# Patient Record
Sex: Female | Born: 1970
Health system: Southern US, Community
[De-identification: ages and names within clinical notes are randomized; demographics above are authoritative.]

## PROBLEM LIST (undated history)

## (undated) DIAGNOSIS — I10 Essential (primary) hypertension: Secondary | ICD-10-CM

## (undated) DIAGNOSIS — Z972 Presence of dental prosthetic device (complete) (partial): Secondary | ICD-10-CM

## (undated) DIAGNOSIS — Z9989 Dependence on other enabling machines and devices: Secondary | ICD-10-CM

## (undated) DIAGNOSIS — F419 Anxiety disorder, unspecified: Secondary | ICD-10-CM

## (undated) DIAGNOSIS — N3281 Overactive bladder: Secondary | ICD-10-CM

## (undated) DIAGNOSIS — N200 Calculus of kidney: Secondary | ICD-10-CM

## (undated) DIAGNOSIS — E162 Hypoglycemia, unspecified: Secondary | ICD-10-CM

## (undated) DIAGNOSIS — E079 Disorder of thyroid, unspecified: Secondary | ICD-10-CM

## (undated) DIAGNOSIS — H269 Unspecified cataract: Secondary | ICD-10-CM

## (undated) DIAGNOSIS — F32A Depression, unspecified: Secondary | ICD-10-CM

## (undated) DIAGNOSIS — K219 Gastro-esophageal reflux disease without esophagitis: Secondary | ICD-10-CM

## (undated) DIAGNOSIS — M810 Age-related osteoporosis without current pathological fracture: Secondary | ICD-10-CM

## (undated) DIAGNOSIS — C73 Malignant neoplasm of thyroid gland: Secondary | ICD-10-CM

## (undated) DIAGNOSIS — E119 Type 2 diabetes mellitus without complications: Secondary | ICD-10-CM

## (undated) DIAGNOSIS — Z87442 Personal history of urinary calculi: Secondary | ICD-10-CM

## (undated) DIAGNOSIS — E785 Hyperlipidemia, unspecified: Secondary | ICD-10-CM

## (undated) DIAGNOSIS — F319 Bipolar disorder, unspecified: Secondary | ICD-10-CM

## (undated) DIAGNOSIS — E669 Obesity, unspecified: Secondary | ICD-10-CM

## (undated) DIAGNOSIS — F329 Major depressive disorder, single episode, unspecified: Secondary | ICD-10-CM

## (undated) DIAGNOSIS — E039 Hypothyroidism, unspecified: Secondary | ICD-10-CM

## (undated) DIAGNOSIS — E890 Postprocedural hypothyroidism: Secondary | ICD-10-CM

## (undated) DIAGNOSIS — M1990 Unspecified osteoarthritis, unspecified site: Secondary | ICD-10-CM

## (undated) HISTORY — PX: THYROIDECTOMY: SHX17

## (undated) HISTORY — DX: Overactive bladder: N32.81

## (undated) HISTORY — PX: PARTIAL HYSTERECTOMY: SHX80

## (undated) HISTORY — DX: Depression, unspecified: F32.A

## (undated) HISTORY — PX: LITHOTRIPSY: SUR834

## (undated) HISTORY — DX: Essential (primary) hypertension: I10

## (undated) HISTORY — DX: Dependence on other enabling machines and devices: Z99.89

## (undated) HISTORY — DX: Malignant neoplasm of thyroid gland: C73

## (undated) HISTORY — DX: Gastro-esophageal reflux disease without esophagitis: K21.9

## (undated) HISTORY — DX: Age-related osteoporosis without current pathological fracture: M81.0

## (undated) HISTORY — DX: Obesity, unspecified: E66.9

## (undated) HISTORY — PX: HEEL SPUR SURGERY: SHX665

## (undated) HISTORY — DX: Unspecified cataract: H26.9

## (undated) HISTORY — PX: TONSILLECTOMY AND ADENOIDECTOMY: SUR1326

## (undated) HISTORY — DX: Hyperlipidemia, unspecified: E78.5

## (undated) HISTORY — PX: OTHER SURGICAL HISTORY: SHX169

## (undated) HISTORY — DX: Calculus of kidney: N20.0

## (undated) SURGERY — COLONOSCOPY
Anesthesia: Monitor Anesthesia Care

---
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
## 1997-07-20 ENCOUNTER — Encounter (HOSPITAL_COMMUNITY): Admission: RE | Admit: 1997-07-20 | Discharge: 1997-10-18 | Payer: Self-pay | Admitting: Psychiatry

## 1997-11-28 ENCOUNTER — Ambulatory Visit (HOSPITAL_COMMUNITY): Admission: RE | Admit: 1997-11-28 | Discharge: 1997-11-28 | Payer: Self-pay | Admitting: Family Medicine

## 1997-12-01 ENCOUNTER — Emergency Department (HOSPITAL_COMMUNITY): Admission: EM | Admit: 1997-12-01 | Discharge: 1997-12-01 | Payer: Self-pay | Admitting: Emergency Medicine

## 1999-10-15 ENCOUNTER — Encounter: Payer: Self-pay | Admitting: Family Medicine

## 1999-10-15 ENCOUNTER — Encounter: Admission: RE | Admit: 1999-10-15 | Discharge: 1999-10-15 | Payer: Self-pay | Admitting: Family Medicine

## 1999-11-08 ENCOUNTER — Emergency Department (HOSPITAL_COMMUNITY): Admission: EM | Admit: 1999-11-08 | Discharge: 1999-11-08 | Payer: Self-pay | Admitting: Emergency Medicine

## 1999-12-05 ENCOUNTER — Encounter (INDEPENDENT_AMBULATORY_CARE_PROVIDER_SITE_OTHER): Payer: Self-pay | Admitting: *Deleted

## 1999-12-05 ENCOUNTER — Ambulatory Visit (HOSPITAL_BASED_OUTPATIENT_CLINIC_OR_DEPARTMENT_OTHER): Admission: RE | Admit: 1999-12-05 | Discharge: 1999-12-05 | Payer: Self-pay

## 2000-03-02 ENCOUNTER — Ambulatory Visit (HOSPITAL_COMMUNITY): Admission: RE | Admit: 2000-03-02 | Discharge: 2000-03-02 | Payer: Self-pay | Admitting: Urology

## 2000-03-02 ENCOUNTER — Encounter: Payer: Self-pay | Admitting: Urology

## 2000-03-02 ENCOUNTER — Encounter (INDEPENDENT_AMBULATORY_CARE_PROVIDER_SITE_OTHER): Payer: Self-pay | Admitting: *Deleted

## 2000-09-13 ENCOUNTER — Other Ambulatory Visit: Admission: RE | Admit: 2000-09-13 | Discharge: 2000-09-13 | Payer: Self-pay | Admitting: Urology

## 2000-10-07 ENCOUNTER — Encounter: Admission: RE | Admit: 2000-10-07 | Discharge: 2001-01-05 | Payer: Self-pay | Admitting: Family Medicine

## 2000-11-12 ENCOUNTER — Other Ambulatory Visit: Admission: RE | Admit: 2000-11-12 | Discharge: 2000-11-12 | Payer: Self-pay | Admitting: *Deleted

## 2001-03-12 ENCOUNTER — Ambulatory Visit (HOSPITAL_BASED_OUTPATIENT_CLINIC_OR_DEPARTMENT_OTHER): Admission: RE | Admit: 2001-03-12 | Discharge: 2001-03-12 | Payer: Self-pay | Admitting: General Surgery

## 2001-03-12 ENCOUNTER — Encounter (INDEPENDENT_AMBULATORY_CARE_PROVIDER_SITE_OTHER): Payer: Self-pay | Admitting: *Deleted

## 2001-11-15 ENCOUNTER — Other Ambulatory Visit: Admission: RE | Admit: 2001-11-15 | Discharge: 2001-11-15 | Payer: Self-pay | Admitting: *Deleted

## 2002-05-05 ENCOUNTER — Encounter: Payer: Self-pay | Admitting: Urology

## 2002-05-05 ENCOUNTER — Ambulatory Visit (HOSPITAL_BASED_OUTPATIENT_CLINIC_OR_DEPARTMENT_OTHER): Admission: RE | Admit: 2002-05-05 | Discharge: 2002-05-05 | Payer: Self-pay | Admitting: Urology

## 2002-10-25 ENCOUNTER — Other Ambulatory Visit: Admission: RE | Admit: 2002-10-25 | Discharge: 2002-10-25 | Payer: Self-pay | Admitting: Obstetrics and Gynecology

## 2003-01-03 ENCOUNTER — Encounter: Admission: RE | Admit: 2003-01-03 | Discharge: 2003-01-03 | Payer: Self-pay | Admitting: Family Medicine

## 2003-01-03 ENCOUNTER — Encounter: Payer: Self-pay | Admitting: Family Medicine

## 2003-02-22 ENCOUNTER — Encounter (INDEPENDENT_AMBULATORY_CARE_PROVIDER_SITE_OTHER): Payer: Self-pay | Admitting: Specialist

## 2003-02-22 ENCOUNTER — Ambulatory Visit (HOSPITAL_COMMUNITY): Admission: RE | Admit: 2003-02-22 | Discharge: 2003-02-22 | Payer: Self-pay | Admitting: Surgery

## 2003-03-30 ENCOUNTER — Encounter: Payer: Self-pay | Admitting: Obstetrics and Gynecology

## 2003-04-04 ENCOUNTER — Encounter (INDEPENDENT_AMBULATORY_CARE_PROVIDER_SITE_OTHER): Payer: Self-pay | Admitting: *Deleted

## 2003-04-04 ENCOUNTER — Ambulatory Visit (HOSPITAL_COMMUNITY): Admission: RE | Admit: 2003-04-04 | Discharge: 2003-04-05 | Payer: Self-pay | Admitting: Surgery

## 2003-06-20 ENCOUNTER — Encounter (HOSPITAL_COMMUNITY): Admission: RE | Admit: 2003-06-20 | Discharge: 2003-09-18 | Payer: Self-pay | Admitting: Endocrinology

## 2003-08-18 ENCOUNTER — Encounter (INDEPENDENT_AMBULATORY_CARE_PROVIDER_SITE_OTHER): Payer: Self-pay | Admitting: Specialist

## 2003-08-18 ENCOUNTER — Observation Stay (HOSPITAL_COMMUNITY): Admission: RE | Admit: 2003-08-18 | Discharge: 2003-08-19 | Payer: Self-pay | Admitting: Obstetrics and Gynecology

## 2004-02-15 ENCOUNTER — Other Ambulatory Visit: Admission: RE | Admit: 2004-02-15 | Discharge: 2004-02-15 | Payer: Self-pay | Admitting: Obstetrics and Gynecology

## 2004-08-12 ENCOUNTER — Encounter (HOSPITAL_COMMUNITY): Admission: RE | Admit: 2004-08-12 | Discharge: 2004-11-10 | Payer: Self-pay | Admitting: Endocrinology

## 2005-04-03 ENCOUNTER — Other Ambulatory Visit: Admission: RE | Admit: 2005-04-03 | Discharge: 2005-04-03 | Payer: Self-pay | Admitting: Obstetrics and Gynecology

## 2005-08-04 ENCOUNTER — Encounter (HOSPITAL_COMMUNITY): Admission: RE | Admit: 2005-08-04 | Discharge: 2005-11-02 | Payer: Self-pay | Admitting: Endocrinology

## 2005-09-03 ENCOUNTER — Ambulatory Visit: Payer: Self-pay | Admitting: Gastroenterology

## 2005-09-04 ENCOUNTER — Ambulatory Visit (HOSPITAL_COMMUNITY): Admission: RE | Admit: 2005-09-04 | Discharge: 2005-09-04 | Payer: Self-pay | Admitting: Gastroenterology

## 2005-09-18 ENCOUNTER — Ambulatory Visit: Payer: Self-pay | Admitting: Gastroenterology

## 2005-09-23 ENCOUNTER — Ambulatory Visit: Payer: Self-pay | Admitting: Gastroenterology

## 2005-09-24 ENCOUNTER — Ambulatory Visit: Payer: Self-pay | Admitting: Gastroenterology

## 2005-10-17 ENCOUNTER — Ambulatory Visit: Payer: Self-pay | Admitting: Gastroenterology

## 2006-02-09 ENCOUNTER — Emergency Department (HOSPITAL_COMMUNITY): Admission: EM | Admit: 2006-02-09 | Discharge: 2006-02-09 | Payer: Self-pay | Admitting: Emergency Medicine

## 2006-06-30 ENCOUNTER — Other Ambulatory Visit: Admission: RE | Admit: 2006-06-30 | Discharge: 2006-06-30 | Payer: Self-pay | Admitting: Obstetrics and Gynecology

## 2006-07-27 ENCOUNTER — Encounter (HOSPITAL_COMMUNITY): Admission: RE | Admit: 2006-07-27 | Discharge: 2006-07-31 | Payer: Self-pay | Admitting: Endocrinology

## 2006-10-16 ENCOUNTER — Encounter: Admission: RE | Admit: 2006-10-16 | Discharge: 2006-10-16 | Payer: Self-pay | Admitting: Gastroenterology

## 2006-12-04 ENCOUNTER — Encounter: Admission: RE | Admit: 2006-12-04 | Discharge: 2006-12-04 | Payer: Self-pay | Admitting: Endocrinology

## 2006-12-16 ENCOUNTER — Ambulatory Visit (HOSPITAL_COMMUNITY): Admission: RE | Admit: 2006-12-16 | Discharge: 2006-12-16 | Payer: Self-pay | Admitting: Endocrinology

## 2006-12-28 ENCOUNTER — Encounter: Admission: RE | Admit: 2006-12-28 | Discharge: 2006-12-28 | Payer: Self-pay | Admitting: Endocrinology

## 2007-03-02 ENCOUNTER — Ambulatory Visit (HOSPITAL_COMMUNITY): Admission: RE | Admit: 2007-03-02 | Discharge: 2007-03-02 | Payer: Self-pay | Admitting: *Deleted

## 2007-04-28 ENCOUNTER — Encounter: Admission: RE | Admit: 2007-04-28 | Discharge: 2007-04-28 | Payer: Self-pay | Admitting: Endocrinology

## 2007-08-10 ENCOUNTER — Ambulatory Visit (HOSPITAL_COMMUNITY): Admission: RE | Admit: 2007-08-10 | Discharge: 2007-08-10 | Payer: Self-pay | Admitting: Endocrinology

## 2009-06-21 ENCOUNTER — Ambulatory Visit (HOSPITAL_COMMUNITY): Admission: RE | Admit: 2009-06-21 | Discharge: 2009-06-21 | Payer: Self-pay | Admitting: Family Medicine

## 2009-08-08 ENCOUNTER — Emergency Department (HOSPITAL_COMMUNITY): Admission: EM | Admit: 2009-08-08 | Discharge: 2009-08-08 | Payer: Self-pay | Admitting: Emergency Medicine

## 2010-05-11 ENCOUNTER — Encounter: Payer: Self-pay | Admitting: Endocrinology

## 2010-05-12 ENCOUNTER — Encounter: Payer: Self-pay | Admitting: Endocrinology

## 2010-05-12 ENCOUNTER — Encounter: Payer: Self-pay | Admitting: Gastroenterology

## 2010-07-09 LAB — COMPREHENSIVE METABOLIC PANEL
ALT: 36 U/L — ABNORMAL HIGH (ref 0–35)
AST: 31 U/L (ref 0–37)
Albumin: 3.4 g/dL — ABNORMAL LOW (ref 3.5–5.2)
Alkaline Phosphatase: 100 U/L (ref 39–117)
BUN: 9 mg/dL (ref 6–23)
CO2: 27 mEq/L (ref 19–32)
Calcium: 9.1 mg/dL (ref 8.4–10.5)
Chloride: 99 mEq/L (ref 96–112)
Creatinine, Ser: 0.7 mg/dL (ref 0.4–1.2)
GFR calc Af Amer: >60 mL/min (ref >60)
GFR calc non Af Amer: >60 mL/min (ref >60)
Glucose, Bld: 111 mg/dL — ABNORMAL HIGH (ref 70–99)
Potassium: 3.5 mEq/L (ref 3.5–5.1)
Sodium: 132 mEq/L — ABNORMAL LOW (ref 135–145)
Total Bilirubin: 0.7 mg/dL (ref 0.3–1.2)
Total Protein: 7.6 g/dL (ref 6.0–8.3)

## 2010-07-09 LAB — DIFFERENTIAL
Basophils Absolute: 0 10*3/uL (ref 0.0–0.1)
Basophils Relative: 0 % (ref 0–1)
Eosinophils Absolute: 0 10*3/uL (ref 0.0–0.7)
Eosinophils Relative: 0 % (ref 0–5)
Lymphocytes Relative: 20 % (ref 12–46)
Lymphs Abs: 2.6 10*3/uL (ref 0.7–4.0)
Monocytes Absolute: 0.8 10*3/uL (ref 0.1–1.0)
Monocytes Relative: 6 % (ref 3–12)
Neutro Abs: 9.9 10*3/uL — ABNORMAL HIGH (ref 1.7–7.7)
Neutrophils Relative %: 74 % (ref 43–77)

## 2010-07-09 LAB — CBC
HCT: 43.4 % (ref 36.0–46.0)
Hemoglobin: 14.9 g/dL (ref 12.0–15.0)
MCHC: 34.2 g/dL (ref 30.0–36.0)
MCV: 96.3 fL (ref 78.0–100.0)
Platelets: 268 10*3/uL (ref 150–400)
RBC: 4.5 MIL/uL (ref 3.87–5.11)
RDW: 13.7 % (ref 11.5–15.5)
WBC: 13.4 10*3/uL — ABNORMAL HIGH (ref 4.0–10.5)

## 2010-07-09 LAB — POCT CARDIAC MARKERS
CKMB, poc: <1 ng/mL — ABNORMAL LOW (ref 1.0–8.0)
Myoglobin, poc: 56.8 ng/mL (ref 12–200)
Troponin i, poc: <0.05 ng/mL (ref 0.00–0.09)

## 2010-07-09 LAB — LIPASE, BLOOD: Lipase: 25 U/L (ref 11–59)

## 2010-09-06 NOTE — Op Note (Signed)
[place]  Patient:    Megan Moreno, Megan Moreno Visit Number: [id number] MRN: [id number]          Service Type: DSU Location: [facility] Attending Physician:  Caleen Essex Dictated by:   Ollen Gross. Vernell Morgans, M.D. Proc. Date: 03/12/01 Admit Date:  03/12/2001 Discharge Date: 03/12/2001                             Operative Report  PREOPERATIVE DIAGNOSIS:  Five millimeter mass of the left axilla.  POSTOPERATIVE DIAGNOSIS:  Five millimeter mass of the left axilla.  OPERATION PERFORMED:  Excision of five millimeter mass from the left axilla.  SURGEON: Ollen Gross. Vernell Morgans, M.D.  ANESTHESIA:  Local.  DESCRIPTION OF PROCEDURE:  After informed consent was obtained the patient was brought to the operating room and placed in the supine position on the operating table.  The patients left axilla was then prepped with Betadine and draped in the usual sterile manner.  The area around the mass was infiltrated with 1% lidocaine with epinephrine and an elliptical incision was used to excise this mass.  The subcutaneous tissue was then divided sharply with the 15 blade knife until the mass was completely excised from the patient.  The incision was found to be hemostatic and was closed with multiple interrupted 4-0 monocril subcuticular stitches.  The wound was then covered with Dermabond, which was allowed to dry and a small clean gauze dressing was then applied.  The patient tolerated the procedure well.  At the end of the case all needle, sponge and instrument counts were correct.  Patient was awakened.  Patient was then taken to the recovery room in stable condition. Dictated by:   Ollen Gross. Vernell Morgans, M.D. Attending Physician:  Caleen Essex DD:  03/15/01 TD:  03/15/01 Job: [id number]

## 2010-09-06 NOTE — Discharge Summary (Signed)
NAME:  Megan Moreno, SLATTEN                          ACCOUNT NO.:  [id number]   MEDICAL RECORD NO.:  [id number]                   PATIENT TYPE:  OBV   LOCATION:  9316                                 FACILITY:  [facility]   PHYSICIAN:  Rudy Jew. Ashley Royalty, M.D.             DATE OF BIRTH:  Nov 16, 1970   DATE OF ADMISSION:  08/18/2003  DATE OF DISCHARGE:  08/19/2003                                 DISCHARGE SUMMARY   DISCHARGE DIAGNOSES:  1. Abnormal uterine bleeding - refractory to medical management.  2. Thyroid carcinoma - status post thyroidectomy.  3. Bipolar disorder.  4. History of nephrolithiasis.  5. Squamous metaplasia without evidence of endometrial hyperplasia or     malignancy.   OPERATIONS AND SPECIAL PROCEDURES:  Total vaginal hysterectomy.   CONSULTATIONS:  None.   DISCHARGE MEDICATIONS:  Motrin.   HISTORY AND PHYSICAL:  This is a 40 year old nulligravida with a long  history of abnormal uterine bleeding and dysmenorrhea.  Her abnormal uterine  bleeding has been refractory to medical management for many months.  She had  made the decision to proceed with definitive therapy in the form of  hysterectomy when she was diagnosed as having thyroid carcinoma.  She  postponed her hysterectomy until after the thyroid carcinoma was treated and  recently presented re-requesting the hysterectomy.  She has been maintained  on Megace satisfactorily during her thyroid therapy.  The procedure  envisioned for her in August was a laparoscopic-assisted vaginal  hysterectomy.  For the remainder of the history and physical please see  chart.   HOSPITAL COURSE:  The patient was admitted to [facility].  Admission laboratory studies were drawn.  On August 18, 2003 she  was taken to the operating room and underwent a total vaginal hysterectomy.  Initially the patient was prepared for laparoscopy.  However, the operator  encountered difficulty getting the umbilical trocar successfully  into the  abdominal cavity.  Hence, the laparoscopic portion of the procedure was  abandoned and the patient underwent simple total vaginal hysterectomy.  The  procedure was uncomplicated.  The patient stated a desire for discharge on  postoperative day #1.  She was felt stable for discharge and thus discharged  home in satisfactory condition.   ACCESSORY CLINICAL FINDINGS:  Hemoglobin and hematocrit on admission were  14.2 and 41.8 respectively.  Repeat values obtained August 19, 2003 were 12.5  and 36.4 respectively.  Type and Rh revealed O negative blood.   DISPOSITION:  The patient is to return to [facility]  in 4-6 weeks for postoperative evaluation.                                               James A. Ashley Royalty, M.D.   JAM/MEDQ  D:  09/05/2003  T:  09/05/2003  Job:  [id number]

## 2010-09-06 NOTE — H&P (Signed)
NAME:  Megan Moreno, Megan Moreno                          ACCOUNT NO.:  [id number]   MEDICAL RECORD NO.:  [id number]                   PATIENT TYPE:  INP   LOCATION:  NA                                   [facility]   PHYSICIAN:  James A. Ashley Royalty, M.D.             DATE OF BIRTH:  January 07, 1971   DATE OF ADMISSION:  08/17/2003  DATE OF DISCHARGE:                                HISTORY & PHYSICAL   HISTORY OF PRESENT ILLNESS:  This is a 40 year old nulligravida with a long  history of abnormal uterine bleeding and dysmenorrhea. Her abnormal uterine  bleeding has been refractory to medical management for many months. She had  made the decision to proceed with definitive therapy in the form of  hysterectomy when she was diagnosed as having thyroid carcinoma. She  postponed her hysterectomy until after thyroid carcinoma was treated and now  is requesting the same. She has been maintained on Megace with satisfactory  control during her therapy. She was recently examined in the office on July 24, 2003. Traction was placed on the cervix and there was reasonable descent.  She has since scheduled for a laparoscopic assisted vaginal hysterectomy.   MEDICATIONS:  Megace 40 mg daily.   PAST MEDICAL HISTORY:  1. Thyroid carcinoma.  2. Obesity.  3. Bipolar disorder.  4. History of diabetes.   PAST SURGICAL HISTORY:  Tonsillectomy. Orthopedic surgery on both shoulders.  Lithotripsy.   ALLERGIES:  Certain narcotic pain relievers, not specified.   FAMILY HISTORY:  Noncontributory.   SOCIAL HISTORY:  The patient denies the use of tobacco or significant  alcohol.   REVIEW OF SYSTEMS:  Noncontributory.   PHYSICAL EXAMINATION:  GENERAL:  A well developed, well nourished pleasant  white female in no acute distress.  VITAL SIGNS:  Afebrile. Vital signs stable.  SKIN:  Warm and dry without lesions.  LYMPH:  There is no supraclavicular, cervical, or inguinal adenopathy.  HEENT:  Normocephalic.  NECK:   Supple without thyromegaly. There is a well healed surgical scar.  CHEST:  Lungs are clear.  CARDIAC:  Regular rate and rhythm.  Without murmur, rub, or gallop.  BREAST:  Examination deferred.  ABDOMEN:  Soft and nontender without masses or organomegaly. Bowel sounds  are active.  MUSCULOSKELETAL:  Examination reveals full range of motion  without edema,  cyanosis, or CVA tenderness.  PELVIC:  Examination (July 24, 2003) external genitalia within normal  limits. Vaginal and cervix were without gross lesions. Bimanual examination  reveals the uterus to be approximately 8 x 4 x 4cm. No adnexal masses are  palpable.   IMPRESSION:  1. Abnormal uterine bleeding, refractory to medical management.  2. Status post thyroidectomy for thyroid carcinoma with subsequent     radioactive Iodine therapy.  3. Bipolar disorder.  4. History of nephrolithiasis.  5. History of diabetes by history.   PLAN:  Laparoscopically assisted vaginal hysterectomy. Risks, benefits,  complications and alternatives have been discussed with the patient. The  possibility of unilateral salpingo-oophorectomy discussed and accepted. The  possibility of exploratory laparotomy discussed and accepted. Questions  invited and answered.                                               James A. Ashley Royalty, M.D.    JAM/MEDQ  D:  08/17/2003  T:  08/18/2003  Job:  [id number]

## 2010-09-06 NOTE — Op Note (Signed)
[place]  Patient:    Megan Moreno, Megan Moreno                       MRN: [id number] Proc. Date: 12/05/99 Adm. Date:  [id number] Attending:  Gennie Alma CC:         Gloriajean Dell. Andrey Campanile, M.D.                           Operative Report  [facility] [place] NUMBER:  [id number]  PREOPERATIVE DIAGNOSIS:  Mass of right breast 3 oclock radial.  POSTOPERATIVE DIAGNOSIS:  Lipoma of right breast.  OPERATION:  Right breast biopsy.  SURGEON:  Milus Mallick, M.D.  ANESTHESIA:  Local infiltration with 1% Xylocaine - 20 cc and monitored anesthesia care.  DESCRIPTION OF PROCEDURE:  Under adequate perioperative intravenous sedation the patients right breast was prepped and draped in the usual fashion.  There was a palpable mass of the right breast at 3 oclock radial that measured 2.5 cm in diameter.  The area was infiltrated with 1% Xylocaine.  A transverse incision was made overlying it, and carried down through the skin and basically just below the skin level was the mass itself which was a lipoma. Lipoma was excised using Bovie electrocoagulation.  Hemostasis was ascertained.  The specimen was sent for routine pathologic study.  The subcuticular layer was reapproximated with a continuous suture of 5-0 Vicryl, 1/2 inch Steri-Strips were applied to the skin, and a sterile dressing was applied.  Estimated blood loss from the procedure was negligible.  The patient tolerated the procedure well, and left the operating room in satisfactory condition. DD:  12/05/99 TD:  12/05/99 Job: [id number]

## 2010-09-06 NOTE — Op Note (Signed)
NAME:  Megan Moreno, Megan Moreno                          ACCOUNT NO.:  [id number]   MEDICAL RECORD NO.:  [id number]                   PATIENT TYPE:  OIB   LOCATION:  2899                                 FACILITY:  [facility]   PHYSICIAN:  Velora Heckler, M.D.                DATE OF BIRTH:  1970/06/13   DATE OF PROCEDURE:  04/04/2003  DATE OF DISCHARGE:                                 OPERATIVE REPORT   PREOPERATIVE DIAGNOSIS:  Thyroid goiter with dominant follicular nodule.   POSTOPERATIVE DIAGNOSIS:  Thyroid goiter with dominant follicular nodule.   OPERATION PERFORMED:  Total thyroidectomy.   SURGEON:  Velora Heckler, M.D.   ASSISTANT:  Adolph Pollack, M.D.   ANESTHESIA:  General.   ESTIMATED BLOOD LOSS:  Minimal.   PREPARATION:  Betadine.   COMPLICATIONS:  None.   INDICATIONS FOR PROCEDURE:  The patient is a 40 year old white female with  thyroid goiter.  This was found on routine physical exam by her primary care  Krishika Bugge.  She underwent thyroid ultrasound which showed a diffusely  enlarged thyroid gland with multiple nodules.  The largest was 4.9 cm in the  left inferior thyroid lobe.  Fine needle aspiration showed follicular  neoplasm.  The patient now comes to surgery for thyroidectomy.   DESCRIPTION OF PROCEDURE:  The procedure was done in operating room #1 at  the [facility]. [facility].  The patient was brought to the  operating room and placed in supine position on the operating room table.  Following administration of general anesthesia, the patient was prepped and  draped in the usual strict aseptic fashion.  After ascertaining that an  adequate level of anesthesia had been obtained, a Kocher incision was made  with a #10 blade.  Dissection was carried down through the skin and  subcutaneous tissues.  Platysma was divided with the electrocautery.  Skin  flaps were developed cephalad and caudad from the thyroid notch to the  sternal notch.  A Mahorner  self-retaining retractor was placed for exposure.  The strap muscles were incised in the midline. Dissection was begun on the  left side.  Strap muscles were reflected laterally.  Middle thyroid vein was  divided between medium Ligaclips.  Inferior pole of the left lobe was quite  large and extends into the thoracic inlet displacing the trachea to the  right.  It was mobilized with gentle blunt dissection and delivered up and  through the wound.  Superior pole extends cephalad for some distance.  It  was carefully dissected out.  Vessels were divided between medium ligaclips  and the gland was mobilized.  Parathyroid tissue was identified and  preserved.  However, the superior parathyroid gland was dissected out on a  fragile vascular pedicle and appears to be devascularized.  It was therefore  cut into several 1 mm fragments and reimplanted into the left  sternocleidomastoid muscle.  The muscle fascia was closed with a 3-0 Vicryl  figure-of-eight sutures and then marked with a medium Ligaclip to identify  its location in the muscle belly.  Good hemostasis was noted.  The gland was  rolled further anteriorly.  Branches of the inferior thyroid artery were  divided between small Ligaclips.  Ligament of Allyson Sabal was transected.  Recurrent laryngeal nerve was identified and was preserved.  The inferior  parathyroid gland was preserved.  The gland was rolled up and onto the  anterior surface of the trachea and it was mobilized across the midline.  The isthmus was then transected between hemostats and suture ligated with 3-  0 Vicryl suture ligatures.  The left lobe was submitted to pathology.  Frozen section biopsy showed multiple follicular nodules which possibly  represent follicular variant of papillary thyroid carcinoma.   Therefore we proceeded with exploration of the right side.  The right  thyroid lobe was also goiterous with palpable nodules in the superior  portion of the gland.  A  decision was made to proceed with total  thyroidectomy based on frozen section biopsy and operative findings.  Therefore, the strap muscles were again reflected laterally.  The middle  thyroid vein was divided between small Ligaclips. Superior pole was  dissected out.  Superior pole vessels were ligated in continuity with 2-0  silk ties and medium Ligaclips and divided.  The gland was rolled  anteriorly.  Both superior and inferior parathyroid glands were identified  and preserved.  Recurrent laryngeal nerve was identified and preserved.  Branches of the inferior thyroid artery were divided between small  Ligaclips.  Ligament of Allyson Sabal was transected and the gland was rolled up and  onto the anterior trachea from which it was excised using the electrocautery  for hemostasis.  The right lobe was submitted to pathology for review.  Both  sides of the neck showed good hemostasis.  Surgicel was placed over the area  of the recurrent laryngeal nerves bilaterally.  Strap muscles were  reapproximated in the midline with interrupted 3-0 Vicryl sutures.  The  platysma was closed with interrupted 3-0 Vicryl sutures.  Skin edges were  closed with running 4-0 Vicryl subcuticular suture.  The wound was washed  and dried and benzoin and Steri-Strips were applied.  Sterile gauze  dressings were applied.  The patient was awakened from anesthesia and  brought to the recovery room in stable condition.  The patient tolerated the  procedure well.                                               Velora Heckler, M.D.    TMG/MEDQ  D:  04/04/2003  T:  04/04/2003  Job:  [id number]   cc:   Dellis Anes. Idell Pickles, M.D.  [address]  [place]  [place] [address]  Fax: [phone]   Clovis Riley, NP [facility]

## 2010-09-06 NOTE — Op Note (Signed)
NAME:  Megan Moreno, Megan Moreno                          ACCOUNT NO.:  [id number]   MEDICAL RECORD NO.:  [id number]                   PATIENT TYPE:  OBV   LOCATION:  9316                                 [facility]   PHYSICIAN:  Rudy Jew. Ashley Royalty, M.D.             DATE OF BIRTH:  Sep 08, 1970   DATE OF PROCEDURE:  08/18/2003  DATE OF DISCHARGE:                                 OPERATIVE REPORT   PREOPERATIVE DIAGNOSES:  1. Abnormal uterine bleeding--refractory to medical management.  2. Status post thyroidectomy for thyroid carcinoma.  3. Bipolar disorder.  4. History of nephrolithiasis.   POSTOPERATIVE DIAGNOSES:  1. Abnormal uterine bleeding--refractory to medical management.  2. Status post thyroidectomy for thyroid carcinoma.  3. Bipolar disorder.  4. History of nephrolithiasis.   PROCEDURE:  Total vaginal hysterectomy.   SURGEON:  Rudy Jew. Ashley Royalty, M.D.   ANESTHESIA:  General.   ASSISTANT:  Charles A. Delcambre, MD   ESTIMATED BLOOD LOSS:  100 mL   COMPLICATIONS:  None.   PACKS/DRAINS:  Foley.   Sponge, needle and instrument count reported as correct x2.   DESCRIPTION OF PROCEDURE:  The patient was taken to the operating room and  placed in the dorsal supine position. After adequate general endotracheal  anesthesia was administered, she was placed in lithotomy position, prepped  and draped in the usual manner for abdominal surgery.  Initially, a  laparoscopic assisted vaginal hysterectomy was entertained.  The patient was  quite obese and an initial attempt was made to place the laparoscope into  the patient's abdominal cavity.  An infraumbilical incision was made in the  longitudinal plane. The longest disposable size 10/11 laparoscopic trocar  was placed through this incision in an attempt to reach the abdominal  cavity.  However, two placements of the trocar failed to achieve the  abdominal cavity as viewed through the laparoscope.  Hence the operator  decided that  laparoscopy was not a viable option as an adjunct to this  patient's hysterectomy. The subcutaneous layer was closed with #0 Vicryl in  an interrupted fashion. The skin of the infraumbilical incision was closed  with 3-0 Monocryl in a subcuticular fashion.  The operator then attempted to  assess the best route of  hysterectomy.  The patient was noted to be quite  obese and had a large panus. The cervix was grasped with a single tooth  tenaculum and descended nicely.  Hence the decision was made to attempt a  vaginal hysterectomy.  A Foley catheter had been previously placed. A  posterior weighted retractor was placed. Jacob's tenaculum was placed on the  anterior lip of the cervix. The cervix was then infiltrated with  approximately 10 mL of 1% Xylocaine with epinephrine.  The cervix was then  circumscribed with a knife below the level of the cervicovaginal  intersection.  A posterior colpotomy incision was successfully made. The  Steiner-Avard retractor was placed.  The uterosacral ligaments were  bilaterally clamped, cut and secured with #1 chromic catgut.  The pedicles  were held.  The bladder was sharply and bluntly dissected superiorly away  from the anterior aspect of the cervix.  The anterior cul-de-sac was  successfully entered. The cardinal ligaments were clamped, cut and secured  with #1 chromic catgut. The uterine vessels were bilaterally clamped, cut  and doubly secured with #1 chromic catgut.  At this point, the fundus was  delivered posteriorly through the opening and the remaining attachments to  the uterus were clamped and cut thus allowing the specimen to be delivered  into the field. The remaining pedicles included the round ligament,  uteroovarian anastomosis and fallopian tube.  Each pedicle was doubly  secured with #1 chromic catgut.   At this point, all the pedicles were inspected and found to be hemostatic.  Copious irrigation was accomplished.  A 2-0 Vicryl running  locking suture  was placed on the posterior vaginal cuff to aid in postoperative hemostasis.  Next a 3-0 Vicryl hemipursestring suture was placed in order to obliterate  the cul-de-sac. This suture ran from uterosacral ligament to uterosacral  ligament. The suture was carefully tied to avoid any damage to the rectal  tissues.  Copious irrigation was accomplished. The vagina was then closed at  the level of the cuff in an anterior posterior fashion with interrupted  sutures of #1 chromic catgut.  The uterosacral ligament sutures were tied in  the midline thus accomplishing a high plication of the uterosacral ligaments  and hopefully aiding in the postoperative vaginal support. Hemostasis was  noted and the procedure terminated.   The patient tolerated the procedure extremely well and was returned to the  recovery room in good condition.  At the conclusion of the procedure, the  urine was clear and copious.                                               James A. Ashley Royalty, M.D.    JAM/MEDQ  D:  08/18/2003  T:  08/19/2003  Job:  [id number]

## 2010-10-08 ENCOUNTER — Other Ambulatory Visit: Payer: Self-pay | Admitting: Internal Medicine

## 2010-10-10 ENCOUNTER — Other Ambulatory Visit (HOSPITAL_COMMUNITY): Payer: Self-pay | Admitting: Internal Medicine

## 2010-10-10 DIAGNOSIS — R112 Nausea with vomiting, unspecified: Secondary | ICD-10-CM

## 2010-10-14 ENCOUNTER — Ambulatory Visit (HOSPITAL_COMMUNITY)
Admission: RE | Admit: 2010-10-14 | Discharge: 2010-10-14 | Disposition: A | Discharge status: Home / Self Care | Payer: Medicare Other | Source: Ambulatory Visit | Attending: Internal Medicine | Admitting: Internal Medicine

## 2010-10-14 DIAGNOSIS — R112 Nausea with vomiting, unspecified: Secondary | ICD-10-CM | POA: Insufficient documentation

## 2010-10-14 DIAGNOSIS — R109 Unspecified abdominal pain: Secondary | ICD-10-CM | POA: Insufficient documentation

## 2010-10-15 ENCOUNTER — Other Ambulatory Visit: Payer: Self-pay

## 2011-12-01 ENCOUNTER — Encounter (HOSPITAL_COMMUNITY): Payer: Self-pay | Admitting: Emergency Medicine

## 2011-12-01 ENCOUNTER — Emergency Department (HOSPITAL_COMMUNITY)
Admission: EM | Admit: 2011-12-01 | Discharge: 2011-12-01 | Disposition: A | Discharge status: Home / Self Care | Payer: Medicare Other | Attending: Emergency Medicine | Admitting: Emergency Medicine

## 2011-12-01 DIAGNOSIS — J029 Acute pharyngitis, unspecified: Secondary | ICD-10-CM

## 2011-12-01 DIAGNOSIS — E079 Disorder of thyroid, unspecified: Secondary | ICD-10-CM | POA: Insufficient documentation

## 2011-12-01 DIAGNOSIS — R070 Pain in throat: Secondary | ICD-10-CM | POA: Insufficient documentation

## 2011-12-01 DIAGNOSIS — J069 Acute upper respiratory infection, unspecified: Secondary | ICD-10-CM

## 2011-12-01 HISTORY — DX: Disorder of thyroid, unspecified: E07.9

## 2011-12-01 HISTORY — DX: Hypoglycemia, unspecified: E16.2

## 2011-12-01 LAB — RAPID STREP SCREEN (MED CTR MEBANE ONLY): Streptococcus, Group A Screen (Direct): NEGATIVE

## 2011-12-01 MED ORDER — IBUPROFEN 100 MG/5ML PO SUSP
600.0000 mg | Freq: Once | ORAL | Status: DC
  Filled 2011-12-01: qty 30

## 2011-12-01 MED ORDER — IBUPROFEN 400 MG PO TABS
600.0000 mg | ORAL_TABLET | Freq: Once | ORAL | Status: AC
  Administered 2011-12-01: 600 mg via ORAL
  Filled 2011-12-01: qty 1

## 2011-12-01 NOTE — ED Provider Notes (Signed)
History  This chart was scribed for Megan Roots, MD by Bennett Scrape. This patient was seen in room TR06C/TR06C and the patient's care was started at 12:30PM.  CSN: [id number]  Arrival date & time 12/01/11  1108   None     Chief Complaint  Patient presents with  . Sore Throat    Patient is a 41 y.o. female presenting with pharyngitis. The history is provided by the patient. No language interpreter was used.  Sore Throat This is a new problem. The current episode started more than 2 days ago. The problem occurs constantly. The problem has not changed since onset.The symptoms are aggravated by swallowing. Nothing relieves the symptoms. She has tried nothing for the symptoms.    Megan Moreno is a 41 y.o. female who presents to the Emergency Department complaining of 3 days of sore throat with post nasal drip, nasal congestion/drainage and nausea. The sore throat is worse with swallowing. She has a fever of 100.2 in the ED but denies having a known fever at home. She denies taking OTC medications at home to improve symptoms. She denies having any known sick contacts with similar symptoms. She has a h/o thyroid disease and hypoglycemia. She denies smoking and alcohol use. Is able to swallow, no sob. No known ill contacts or bad food ingestion.     Past Medical History  Diagnosis Date  . Thyroid disease   . Hypoglycemia     History reviewed. No pertinent past surgical history.  History reviewed. No pertinent family history.  History  Substance Use Topics  . Smoking status: Never Smoker   . Smokeless tobacco: Not on file  . Alcohol Use: No    No OB history provided.  Review of Systems  Constitutional: Positive for fever. Negative for chills.  HENT: Positive for sore throat and postnasal drip. Negative for trouble swallowing.   Respiratory: Negative for cough.   Gastrointestinal: Negative for nausea and vomiting.    Allergies  Review of patient's allergies indicates  no known allergies.  Home Medications   Current Outpatient Rx  Name Route Sig Dispense Refill  . FUROSEMIDE 20 MG PO TABS Oral Take 20 mg by mouth 2 (two) times daily.    . ARTHRITIS RELIEF PO Oral Take 1 tablet by mouth 2 (two) times daily.    Marland Kitchen LEVOTHYROXINE SODIUM 137 MCG PO TABS Oral Take 274 mcg by mouth daily.    Marland Kitchen LISINOPRIL 5 MG PO TABS Oral Take 5 mg by mouth daily.    Marland Kitchen LORAZEPAM 0.5 MG PO TABS Oral Take 0.5 mg by mouth 2 (two) times daily as needed. For anxiety.    . ADULT MULTIVITAMIN W/MINERALS CH Oral Take 1 tablet by mouth daily.    . ORLISTAT 60 MG PO CAPS Oral Take 60 mg by mouth daily.    Marland Kitchen POTASSIUM CHLORIDE CRYS ER 10 MEQ PO TBCR Oral Take 10 mEq by mouth 2 (two) times daily.      Triage Vitals: BP 137/84  Pulse 100  Temp 100.2 F (37.9 C) (Oral)  Resp 18  SpO2 95%  Physical Exam  Nursing note and vitals reviewed. Constitutional: She is oriented to person, place, and time. She appears well-developed and well-nourished. No distress.  HENT:  Head: Normocephalic and atraumatic.       Oropharynx is erythematous, no abscess. No exudate. No trismus. No asymmetric swelling to pharynx or abscess noted (pt denies unilateral pain or swelling). Mild nasal congestion  Eyes: Conjunctivae and  EOM are normal.  Neck: Neck supple. No tracheal deviation present.  Cardiovascular: Normal rate.   Pulmonary/Chest: Effort normal and breath sounds normal. No respiratory distress.  Musculoskeletal: Normal range of motion. She exhibits no edema.  Neurological: She is alert and oriented to person, place, and time.  Skin: Skin is warm and dry. No rash noted.  Psychiatric: She has a normal mood and affect. Her behavior is normal.    ED Course  Procedures (including critical care time)  DIAGNOSTIC STUDIES: Oxygen Saturation is 95% on room air, adequate by my interpretation.    COORDINATION OF CARE: 12:36PM-Discussed treatment plan which includes a strep test with pt at bedside and  pt agreed to plan.     MDM  I personally performed the services described in this documentation, which was scribed in my presence. The recorded information has been reviewed and considered. Megan Roots, MD   Pt able to swallow, no stridor or sob. No asymmetric swelling, abscess.   Strep screen neg. Discussed diff dx w pt.   Megan Roots, MD 12/01/11 1257

## 2011-12-01 NOTE — ED Notes (Signed)
Pt c/o sorethroat x several days with trouble swallowing this am; pt with fever today; pt handling saliva

## 2012-10-21 ENCOUNTER — Other Ambulatory Visit: Payer: Self-pay | Admitting: Obstetrics and Gynecology

## 2012-10-21 DIAGNOSIS — R102 Pelvic and perineal pain: Secondary | ICD-10-CM

## 2012-10-26 ENCOUNTER — Other Ambulatory Visit: Payer: Medicare Other

## 2012-11-15 ENCOUNTER — Other Ambulatory Visit: Payer: Medicare Other

## 2012-11-17 ENCOUNTER — Other Ambulatory Visit: Payer: Medicare Other

## 2012-11-19 ENCOUNTER — Ambulatory Visit
Admission: RE | Admit: 2012-11-19 | Discharge: 2012-11-19 | Disposition: A | Discharge status: Home / Self Care | Payer: Medicare Other | Source: Ambulatory Visit | Attending: Obstetrics and Gynecology | Admitting: Obstetrics and Gynecology

## 2012-11-19 DIAGNOSIS — R102 Pelvic and perineal pain: Secondary | ICD-10-CM

## 2012-11-19 MED ORDER — IOHEXOL 300 MG/ML  SOLN
100.0000 mL | Freq: Once | INTRAMUSCULAR | Status: AC | PRN
  Administered 2012-11-19: 100 mL via INTRAVENOUS

## 2013-05-18 ENCOUNTER — Other Ambulatory Visit (HOSPITAL_COMMUNITY): Payer: Self-pay | Admitting: Internal Medicine

## 2013-05-27 ENCOUNTER — Other Ambulatory Visit: Payer: Self-pay | Admitting: Internal Medicine

## 2013-05-27 DIAGNOSIS — N63 Unspecified lump in unspecified breast: Secondary | ICD-10-CM

## 2013-06-01 ENCOUNTER — Other Ambulatory Visit: Payer: Medicare Other

## 2013-06-03 ENCOUNTER — Ambulatory Visit
Admission: RE | Admit: 2013-06-03 | Discharge: 2013-06-03 | Disposition: A | Discharge status: Home / Self Care | Payer: Medicare Other | Source: Ambulatory Visit | Attending: Internal Medicine | Admitting: Internal Medicine

## 2013-06-03 ENCOUNTER — Ambulatory Visit
Admission: RE | Admit: 2013-06-03 | Discharge: 2013-06-03 | Disposition: A | Discharge status: Home / Self Care | Payer: Self-pay | Source: Ambulatory Visit | Attending: Internal Medicine | Admitting: Internal Medicine

## 2013-06-03 DIAGNOSIS — N63 Unspecified lump in unspecified breast: Secondary | ICD-10-CM

## 2013-09-07 ENCOUNTER — Ambulatory Visit (INDEPENDENT_AMBULATORY_CARE_PROVIDER_SITE_OTHER): Payer: Medicare Other | Admitting: Obstetrics & Gynecology

## 2013-09-07 ENCOUNTER — Encounter: Payer: Self-pay | Admitting: Obstetrics & Gynecology

## 2013-09-07 ENCOUNTER — Other Ambulatory Visit: Payer: Self-pay

## 2013-09-07 VITALS — BP 136/80 | HR 87 | Temp 98.7°F | Ht 59.0 in | Wt 326.0 lb

## 2013-09-07 DIAGNOSIS — N9089 Other specified noninflammatory disorders of vulva and perineum: Secondary | ICD-10-CM

## 2013-09-07 NOTE — Progress Notes (Signed)
Patient ID: Megan Moreno, female   DOB: 1970/08/26, 43 y.o.   MRN: [id number]  Chief Complaint  Patient presents with  . Problem    Vaginal irritation    HPI Megan Moreno is a 43 y.o. female.   Vaginal Pain Primary symptoms comment: genital pain, irritation. This is a chronic problem. The current episode started more than 1 year ago. The problem occurs constantly. The problem has been unchanged. The pain is mild. The problem affects both sides. Associated symptoms include dysuria (external). Exacerbated by: body wash/perfume. She has tried nothing for the symptoms.    Past Medical History  Diagnosis Date  . Thyroid disease   . Hypoglycemia     History reviewed. No pertinent past surgical history.  Family History  Problem Relation Age of Onset  . Ovarian cancer Mother     Social History History  Substance Use Topics  . Smoking status: Never Smoker   . Smokeless tobacco: Never Used  . Alcohol Use: No    Allergies  Allergen Reactions  . Morphine And Related Nausea And Vomiting    Hallucinations   . Oxycodone Nausea And Vomiting    Hallucinations       Current Outpatient Prescriptions  Medication Sig Dispense Refill  . furosemide (LASIX) 20 MG tablet Take 20 mg by mouth 2 (two) times daily.      . Homeopathic Products (ARTHRITIS RELIEF PO) Take 1 tablet by mouth 2 (two) times daily.      Marland Kitchen levothyroxine (SYNTHROID, LEVOTHROID) 137 MCG tablet Take 225 mcg by mouth daily.       Marland Kitchen lisinopril (PRINIVIL,ZESTRIL) 5 MG tablet Take 5 mg by mouth daily.      Marland Kitchen LORazepam (ATIVAN) 0.5 MG tablet Take 0.5 mg by mouth 2 (two) times daily as needed. For anxiety.      . Multiple Vitamin (MULTIVITAMIN WITH MINERALS) TABS Take 1 tablet by mouth daily.      . potassium chloride (K-DUR,KLOR-CON) 10 MEQ tablet Take 10 mEq by mouth 2 (two) times daily.       No current facility-administered medications for this visit.    Review of Systems Review of Systems  Genitourinary:  Positive for dysuria (external) and vaginal pain.   Constitutional: negative for fatigue and weight loss Respiratory: negative for cough and wheezing Cardiovascular: negative for chest pain, fatigue and palpitations Gastrointestinal: negative for abdominal pain and change in bowel habits Genitourinary:negative for vaginal discharge Integument/breast: negative for nipple discharge Musculoskeletal:negative for myalgias Neurological: negative for gait problems and tremors Behavioral/Psych: negative for abusive relationship, depression Endocrine: negative for temperature intolerance     Blood pressure 136/80, pulse 87, temperature 98.7 F (37.1 C), height 4\' 11"  (1.499 m), weight 147.873 kg (326 lb).  Physical Exam Physical Exam General:   alert  Skin:   no rash or abnormalities  Lungs:   clear to auscultation bilaterally  Heart:   regular rate and rhythm, S1, S2 normal, no murmur, click, rub or gallop  Breasts:   normal without suspicious masses, skin or nipple changes or axillary nodes  Abdomen:  normal findings: no organomegaly, soft, non-tender and no hernia  Pelvis:  External genitalia: moderate erythema bilaterally Urinary system: urethral meatus normal and bladder without fullness, nontender     The patient was consented for a biopsy of the vulva.  The left labia majus was prepped.  The skin was infiltrated with 1% lidocaine.  A punch biopsy was taken.  Silver nitrate was applied.  Data Reviewed  None  Assessment    Vulva irritation--DDX--vestibulitis, yeast, atopic dermatitis--less likely dystrophy    Plan   Avoid irritants Return in 2 weeks          Megan Moreno 09/07/2013, 2:46 PM

## 2013-09-07 NOTE — Patient Instructions (Signed)
Vulva Biopsy Care After These instructions give you information on caring for yourself after your procedure. Your doctor may also give you more specific instructions. Call your doctor if you have any problems or questions after your procedure. HOME CARE   Only take medicine as told by your doctor.  Do not rub the biopsy area after you pee (urinate). Gently pat the area dry. You can use a bottle filled with warm water (peri-bottle) to clean the area. Gently wipe from front to back.  Clean the area using water and mild soap. Gently pat the area dry.  Check the biopsy area every day using a mirror. Make sure it is healing.  Do not have sex (intercourse) for 3 4 days or as told by your doctor.  Avoid exercise (running, biking) for 2 3 days or as told by your doctor.  You may shower after the procedure. Avoid bathing and swimming until the stitches (sutures) dissolve and healing is complete.  Wear loose cotton underwear. Do not wear tight pants.  Keep all follow-up visits with your doctor.  Call your doctor to get your test results. GET HELP RIGHT AWAY IF:   You have pain that gets worse and is not helped by pain medicine.  Your vaginal area becomes puffy (swollen) or red.  You have fluid or an abnormally bad smell coming from your vagina.  You have a fever. MAKE SURE YOU:  Understand these instructions.  Will watch your condition.  Will get help right away if you are not doing well or get worse. Document Released: 07/04/2008 Document Revised: 03/24/2012 Document Reviewed: 02/02/2012 [employer] Patient Information 2014 [employer].  

## 2013-09-21 ENCOUNTER — Ambulatory Visit (INDEPENDENT_AMBULATORY_CARE_PROVIDER_SITE_OTHER): Payer: Medicare Other | Admitting: Obstetrics & Gynecology

## 2013-09-21 ENCOUNTER — Encounter: Payer: Self-pay | Admitting: Obstetrics & Gynecology

## 2013-09-21 VITALS — BP 118/76 | HR 83 | Temp 98.6°F | Ht 59.0 in

## 2013-09-21 DIAGNOSIS — N906 Unspecified hypertrophy of vulva: Secondary | ICD-10-CM

## 2013-09-21 DIAGNOSIS — N9069 Other specified hypertrophy of vulva: Secondary | ICD-10-CM

## 2013-09-21 MED ORDER — HYDROCORTISONE 1 % EX OINT: 1.0000 "application " | TOPICAL_OINTMENT | Freq: Every day | CUTANEOUS | Status: DC

## 2013-09-22 DIAGNOSIS — N9069 Other specified hypertrophy of vulva: Secondary | ICD-10-CM | POA: Insufficient documentation

## 2013-09-22 NOTE — Progress Notes (Signed)
Patient ID: EZRA MARQUESS, female   DOB: [birth year], 43 y.o.   MRN: [id number]  Chief Complaint  Patient presents with  . Follow-up    Biopsy Results     HPI MATY ZEISLER is a 43 y.o. female.  No new complaints. HPI  Past Medical History  Diagnosis Date  . Thyroid disease   . Hypoglycemia     History reviewed. No pertinent past surgical history.  Family History  Problem Relation Age of Onset  . Ovarian cancer Mother     Social History History  Substance Use Topics  . Smoking status: Never Smoker   . Smokeless tobacco: Never Used  . Alcohol Use: No    Allergies  Allergen Reactions  . Morphine And Related Nausea And Vomiting    Hallucinations   . Oxycodone Nausea And Vomiting    Hallucinations       Current Outpatient Prescriptions  Medication Sig Dispense Refill  . furosemide (LASIX) 20 MG tablet Take 20 mg by mouth 2 (two) times daily.      . Homeopathic Products (ARTHRITIS RELIEF PO) Take 1 tablet by mouth 2 (two) times daily.      Marland Kitchen levothyroxine (SYNTHROID, LEVOTHROID) 137 MCG tablet Take 225 mcg by mouth daily.       Marland Kitchen lisinopril (PRINIVIL,ZESTRIL) 5 MG tablet Take 5 mg by mouth daily.      Marland Kitchen LORazepam (ATIVAN) 0.5 MG tablet Take 0.5 mg by mouth 2 (two) times daily as needed. For anxiety.      . Multiple Vitamin (MULTIVITAMIN WITH MINERALS) TABS Take 1 tablet by mouth daily.      Marland Kitchen OVER THE COUNTER MEDICATION Take 2 capsules by mouth daily. Lipozene      . potassium chloride (K-DUR,KLOR-CON) 10 MEQ tablet Take 10 mEq by mouth 2 (two) times daily.      Marland Kitchen atorvastatin (LIPITOR) 10 MG tablet       . hydrocortisone 1 % ointment Apply 1 application topically daily. Apply once daily for 2-4 weeks then two times a week until symptoms resolve. Then patient can apply lowest dose to keep symptoms from coming back.  30 g  1  . LITHOBID 300 MG CR tablet       . potassium chloride (MICRO-K) 10 MEQ CR capsule       . VESICARE 10 MG tablet        No current  facility-administered medications for this visit.    Review of Systems Review of Systems Constitutional: negative for fatigue and weight loss Respiratory: negative for cough and wheezing Cardiovascular: negative for chest pain, fatigue and palpitations Gastrointestinal: negative for abdominal pain and change in bowel habits Genitourinary:positive for irritation Integument/breast: negative for nipple discharge Musculoskeletal:negative for myalgias Neurological: negative for gait problems and tremors Behavioral/Psych: negative for abusive relationship, depression Endocrine: negative for temperature intolerance     Blood pressure 118/76, pulse 83, temperature 98.6 F (37 C), height 4\' 11"  (1.499 m).  Physical Exam Physical Exam     Data Reviewed Pathology report  Assessment  Vulva hyperplasia    Plan    Meds ordered this encounter  Medications  . OVER THE COUNTER MEDICATION    Sig: Take 2 capsules by mouth daily. Lipozene  . hydrocortisone 1 % ointment    Sig: Apply 1 application topically daily. Apply once daily for 2-4 weeks then two times a week until symptoms resolve. Then patient can apply lowest dose to keep symptoms from coming back.    Dispense:  30 g    Refill:  1  . atorvastatin (LIPITOR) 10 MG tablet    Sig:   . LITHOBID 300 MG CR tablet    Sig:   . potassium chloride (MICRO-K) 10 MEQ CR capsule    Sig:   . VESICARE 10 MG tablet    Sig:    Possible management options include: topical steroids Follow up as needed.      Lahoma Crocker 09/22/2013, 10:28 AM

## 2013-09-22 NOTE — Patient Instructions (Signed)
Disorders of the Vulva It is important to know the outside (external) area of the female genitalia to properly examine yourself. The vulva or labia, sometimes also called the lips around the vagina, covers the pubic bone and surrounds the vagina. The larger or outer labia is called the labia majora. The inner or smaller labia is called the labia minora. The clitoris is at the top of the vulva. It is covered by a small area of tissue called the hood. Below the clitoris is the opening of the tube from the bladder, which is the urethral opening. Just below the urethral opening is the opening of the vagina. This area is called the vestibule. Inside the vestibule are bartholin and skene glands that lubricate the outside of the vagina during sexual intercourse. The perineum is the area that lies between the vagina and anus. You should examine your external genitalia once a month just as you do your self breast examination. SIGNS AND SYMPTOMS TO LOOK FOR DURING YOUR EXAMINATION:  Swelling.  Redness.  Bumps.  Blisters.  Black or white areas.  Itching.  Bleeding.  Burning. TYPES OF VULVAR PROBLEMS  Infections.  Yeast (fungus) is the most common infection that causes redness, swelling, itching and a thick white vaginal discharge. Women with diabetes, taking medicines that kill germs (antibiotics) or on birth control pills are at risk for yeast infection. This infection is treated with vaginal creams, suppositories and anti-itch cream for the vulva.  Genital warts (condyloma) are caused by the human papilloma virus. They are raised bumps that can itch, bleed, cause discomfort and sometimes appear in groups like cauliflower. This is a sexually transmitted disease (STD) caused by sexual contact. This can be treated with topical medication, freezing, electrocautery, laser or surgical removal.  Genital herpes is a virus that causes redness, swelling, blisters and ulcers that can cause itching and are  very painful. This is also a STD. There are medications to control the symptoms of herpes, but there is no cure. Once you have it, you keep getting it because the virus stays in your body.  Contact dermatitis. Contact dermatitis is an irritation of the vulva that can cause redness, swelling and itching. It can be caused by:  Perfumed toilet paper.  Deodorants.  Talcum powder.  Hygiene sprays.  Tampons.  Soaps and fabric softeners.  Wearing wet underwear and bathing suits too long.  Spermicide.  Condoms.  Diaphragms.  Poison ivy. Treatment will depend on eliminating the cause and treating the symptoms.  Vulvodynia.  Vulvodynia is "painful vulva." It includes burning, itching, irritation and a feeling of rawness or bruising of the vulva. Treating this problem depends on the cause and diagnosis. Treatment can take a long time.  Vulvar Dystrophy.  Vulvar Dystrophy is a disorder of the skin of the vulva. The skin can be too thick with hard raised patches or too thin skin showing wrinkles. Vulvar dystrophy can cause redness or white patches, itching and burning sensation. A biopsy may be needed to diagnose the problem. Treatment with creams or ointments depends on the diagnosis.  Vulvar Cancer.  Vulvar cancer is usually a form of squamus skin cancer. Other types of vulvar cancer like melenoma (a dark or black, irregular shaped mole that can bleed easily) and adenocarcinoma (red, itchy, scaly area that looks like eczema) are rare. Treatment is usually surgery to remove the cancerous area, the vulva and the lymph nodes in the groin. This can be done with or without radiation and chemotherapy. HOME  CARE INSTRUCTIONS   Look at your vulva and external genital area every month.  Follow and finish all treatment given to you by your caregiver.  Do not use perfumed or scented soaps, detergents, hygiene spray, talcum powder, tampons, douches or toilet paper.  Remove your tampon every 6  hours. Do not leave tampons in overnight.  Wear loose-fitting clothing.  Wear underwear that is cotton.  Avoid spermicidal creams or foams that irritate you. SEEK MEDICAL CARE IF:   You notice changes on your vulva such as redness, swelling, itching, color changes, bleeding, small bumps, blisters or any discomfort.  You develop an abnormal vaginal discharge.  You have painful sexual intercourse.  You notice swelling of the lymph nodes in your groin. Document Released: 09/24/2007 Document Revised: 06/30/2011 Document Reviewed: 07/08/2010 [facility] Patient Information 2014 [place], [place].

## 2013-12-22 ENCOUNTER — Ambulatory Visit: Payer: Medicare Other | Admitting: Obstetrics & Gynecology

## 2014-04-17 ENCOUNTER — Encounter: Payer: Self-pay | Admitting: *Deleted

## 2014-04-18 ENCOUNTER — Encounter: Payer: Self-pay | Admitting: Obstetrics & Gynecology

## 2014-07-27 DIAGNOSIS — E782 Mixed hyperlipidemia: Secondary | ICD-10-CM | POA: Diagnosis not present

## 2014-08-11 DIAGNOSIS — E782 Mixed hyperlipidemia: Secondary | ICD-10-CM | POA: Diagnosis not present

## 2014-08-11 DIAGNOSIS — I10 Essential (primary) hypertension: Secondary | ICD-10-CM | POA: Diagnosis not present

## 2014-08-17 DIAGNOSIS — C73 Malignant neoplasm of thyroid gland: Secondary | ICD-10-CM | POA: Diagnosis not present

## 2014-08-17 DIAGNOSIS — E1165 Type 2 diabetes mellitus with hyperglycemia: Secondary | ICD-10-CM | POA: Diagnosis not present

## 2014-08-17 DIAGNOSIS — I10 Essential (primary) hypertension: Secondary | ICD-10-CM | POA: Diagnosis not present

## 2014-08-17 DIAGNOSIS — E890 Postprocedural hypothyroidism: Secondary | ICD-10-CM | POA: Diagnosis not present

## 2014-08-29 DIAGNOSIS — E890 Postprocedural hypothyroidism: Secondary | ICD-10-CM | POA: Diagnosis not present

## 2014-11-02 DIAGNOSIS — N390 Urinary tract infection, site not specified: Secondary | ICD-10-CM | POA: Diagnosis not present

## 2014-11-02 DIAGNOSIS — R399 Unspecified symptoms and signs involving the genitourinary system: Secondary | ICD-10-CM | POA: Diagnosis not present

## 2014-11-23 DIAGNOSIS — R32 Unspecified urinary incontinence: Secondary | ICD-10-CM | POA: Diagnosis not present

## 2014-11-23 DIAGNOSIS — N390 Urinary tract infection, site not specified: Secondary | ICD-10-CM | POA: Diagnosis not present

## 2015-01-16 DIAGNOSIS — N3281 Overactive bladder: Secondary | ICD-10-CM | POA: Diagnosis not present

## 2015-01-16 DIAGNOSIS — N3941 Urge incontinence: Secondary | ICD-10-CM | POA: Diagnosis not present

## 2015-05-14 DIAGNOSIS — N3941 Urge incontinence: Secondary | ICD-10-CM | POA: Diagnosis not present

## 2015-05-14 DIAGNOSIS — Z0000 Encounter for general adult medical examination without abnormal findings: Secondary | ICD-10-CM | POA: Diagnosis not present

## 2015-05-14 DIAGNOSIS — N3281 Overactive bladder: Secondary | ICD-10-CM | POA: Diagnosis not present

## 2015-07-12 DIAGNOSIS — K2900 Acute gastritis without bleeding: Secondary | ICD-10-CM | POA: Diagnosis not present

## 2015-07-12 DIAGNOSIS — R197 Diarrhea, unspecified: Secondary | ICD-10-CM | POA: Diagnosis not present

## 2015-09-06 DIAGNOSIS — E782 Mixed hyperlipidemia: Secondary | ICD-10-CM | POA: Diagnosis not present

## 2015-09-06 DIAGNOSIS — I10 Essential (primary) hypertension: Secondary | ICD-10-CM | POA: Diagnosis not present

## 2015-09-12 DIAGNOSIS — I10 Essential (primary) hypertension: Secondary | ICD-10-CM | POA: Diagnosis not present

## 2015-09-12 DIAGNOSIS — R197 Diarrhea, unspecified: Secondary | ICD-10-CM | POA: Diagnosis not present

## 2015-09-12 DIAGNOSIS — E782 Mixed hyperlipidemia: Secondary | ICD-10-CM | POA: Diagnosis not present

## 2015-09-12 DIAGNOSIS — N3281 Overactive bladder: Secondary | ICD-10-CM | POA: Diagnosis not present

## 2015-09-13 DIAGNOSIS — R197 Diarrhea, unspecified: Secondary | ICD-10-CM | POA: Diagnosis not present

## 2015-09-20 DIAGNOSIS — E782 Mixed hyperlipidemia: Secondary | ICD-10-CM | POA: Diagnosis not present

## 2015-09-20 DIAGNOSIS — E890 Postprocedural hypothyroidism: Secondary | ICD-10-CM | POA: Diagnosis not present

## 2015-09-20 DIAGNOSIS — I10 Essential (primary) hypertension: Secondary | ICD-10-CM | POA: Diagnosis not present

## 2015-09-20 DIAGNOSIS — C73 Malignant neoplasm of thyroid gland: Secondary | ICD-10-CM | POA: Diagnosis not present

## 2015-09-20 DIAGNOSIS — E1165 Type 2 diabetes mellitus with hyperglycemia: Secondary | ICD-10-CM | POA: Diagnosis not present

## 2015-09-21 DIAGNOSIS — Z79899 Other long term (current) drug therapy: Secondary | ICD-10-CM | POA: Diagnosis not present

## 2015-11-28 DIAGNOSIS — E890 Postprocedural hypothyroidism: Secondary | ICD-10-CM | POA: Diagnosis not present

## 2015-11-28 DIAGNOSIS — I10 Essential (primary) hypertension: Secondary | ICD-10-CM | POA: Diagnosis not present

## 2015-11-28 DIAGNOSIS — C73 Malignant neoplasm of thyroid gland: Secondary | ICD-10-CM | POA: Diagnosis not present

## 2015-11-28 DIAGNOSIS — E782 Mixed hyperlipidemia: Secondary | ICD-10-CM | POA: Diagnosis not present

## 2015-12-05 DIAGNOSIS — N390 Urinary tract infection, site not specified: Secondary | ICD-10-CM | POA: Diagnosis not present

## 2015-12-05 DIAGNOSIS — I10 Essential (primary) hypertension: Secondary | ICD-10-CM | POA: Diagnosis not present

## 2015-12-05 DIAGNOSIS — E1165 Type 2 diabetes mellitus with hyperglycemia: Secondary | ICD-10-CM | POA: Diagnosis not present

## 2015-12-05 DIAGNOSIS — E782 Mixed hyperlipidemia: Secondary | ICD-10-CM | POA: Diagnosis not present

## 2016-01-03 DIAGNOSIS — M549 Dorsalgia, unspecified: Secondary | ICD-10-CM | POA: Diagnosis not present

## 2016-01-03 DIAGNOSIS — M5134 Other intervertebral disc degeneration, thoracic region: Secondary | ICD-10-CM | POA: Diagnosis not present

## 2016-01-03 DIAGNOSIS — R222 Localized swelling, mass and lump, trunk: Secondary | ICD-10-CM | POA: Diagnosis not present

## 2016-01-07 ENCOUNTER — Other Ambulatory Visit: Payer: Self-pay | Admitting: Internal Medicine

## 2016-01-07 DIAGNOSIS — M549 Dorsalgia, unspecified: Secondary | ICD-10-CM

## 2016-01-15 ENCOUNTER — Ambulatory Visit
Admission: RE | Admit: 2016-01-15 | Discharge: 2016-01-15 | Disposition: A | Discharge status: Home / Self Care | Payer: Medicare Other | Source: Ambulatory Visit | Attending: Internal Medicine | Admitting: Internal Medicine

## 2016-01-15 DIAGNOSIS — R310 Gross hematuria: Secondary | ICD-10-CM | POA: Diagnosis not present

## 2016-01-15 DIAGNOSIS — M549 Dorsalgia, unspecified: Secondary | ICD-10-CM

## 2016-01-15 DIAGNOSIS — N200 Calculus of kidney: Secondary | ICD-10-CM | POA: Diagnosis not present

## 2016-01-15 DIAGNOSIS — R222 Localized swelling, mass and lump, trunk: Secondary | ICD-10-CM | POA: Diagnosis not present

## 2016-01-22 ENCOUNTER — Other Ambulatory Visit (HOSPITAL_COMMUNITY): Payer: Self-pay | Admitting: Internal Medicine

## 2016-01-22 DIAGNOSIS — R222 Localized swelling, mass and lump, trunk: Secondary | ICD-10-CM

## 2016-02-28 DIAGNOSIS — E782 Mixed hyperlipidemia: Secondary | ICD-10-CM | POA: Diagnosis not present

## 2016-02-28 DIAGNOSIS — E1165 Type 2 diabetes mellitus with hyperglycemia: Secondary | ICD-10-CM | POA: Diagnosis not present

## 2016-02-28 DIAGNOSIS — I10 Essential (primary) hypertension: Secondary | ICD-10-CM | POA: Diagnosis not present

## 2016-03-06 DIAGNOSIS — I10 Essential (primary) hypertension: Secondary | ICD-10-CM | POA: Diagnosis not present

## 2016-03-06 DIAGNOSIS — E1165 Type 2 diabetes mellitus with hyperglycemia: Secondary | ICD-10-CM | POA: Diagnosis not present

## 2016-03-06 DIAGNOSIS — E782 Mixed hyperlipidemia: Secondary | ICD-10-CM | POA: Diagnosis not present

## 2016-04-02 DIAGNOSIS — R350 Frequency of micturition: Secondary | ICD-10-CM | POA: Diagnosis not present

## 2016-04-02 DIAGNOSIS — N390 Urinary tract infection, site not specified: Secondary | ICD-10-CM | POA: Diagnosis not present

## 2016-05-06 DIAGNOSIS — L259 Unspecified contact dermatitis, unspecified cause: Secondary | ICD-10-CM | POA: Diagnosis not present

## 2016-05-29 DIAGNOSIS — I10 Essential (primary) hypertension: Secondary | ICD-10-CM | POA: Diagnosis not present

## 2016-05-29 DIAGNOSIS — E782 Mixed hyperlipidemia: Secondary | ICD-10-CM | POA: Diagnosis not present

## 2016-05-29 DIAGNOSIS — E1165 Type 2 diabetes mellitus with hyperglycemia: Secondary | ICD-10-CM | POA: Diagnosis not present

## 2016-06-10 DIAGNOSIS — E1165 Type 2 diabetes mellitus with hyperglycemia: Secondary | ICD-10-CM | POA: Diagnosis not present

## 2016-06-10 DIAGNOSIS — E782 Mixed hyperlipidemia: Secondary | ICD-10-CM | POA: Diagnosis not present

## 2016-06-10 DIAGNOSIS — I10 Essential (primary) hypertension: Secondary | ICD-10-CM | POA: Diagnosis not present

## 2016-07-16 ENCOUNTER — Other Ambulatory Visit (HOSPITAL_COMMUNITY): Payer: Self-pay | Admitting: Internal Medicine

## 2016-07-16 DIAGNOSIS — R110 Nausea: Secondary | ICD-10-CM | POA: Diagnosis not present

## 2016-07-16 DIAGNOSIS — R109 Unspecified abdominal pain: Secondary | ICD-10-CM | POA: Diagnosis not present

## 2016-07-16 DIAGNOSIS — R1011 Right upper quadrant pain: Secondary | ICD-10-CM

## 2016-07-16 DIAGNOSIS — E890 Postprocedural hypothyroidism: Secondary | ICD-10-CM | POA: Diagnosis not present

## 2016-07-16 DIAGNOSIS — C73 Malignant neoplasm of thyroid gland: Secondary | ICD-10-CM | POA: Diagnosis not present

## 2016-07-18 ENCOUNTER — Ambulatory Visit (HOSPITAL_COMMUNITY): Payer: Medicare Other

## 2016-07-25 ENCOUNTER — Ambulatory Visit (HOSPITAL_COMMUNITY)
Admission: RE | Admit: 2016-07-25 | Discharge: 2016-07-25 | Disposition: A | Discharge status: Home / Self Care | Payer: Medicare Other | Source: Ambulatory Visit | Attending: Internal Medicine | Admitting: Internal Medicine

## 2016-07-25 DIAGNOSIS — R1011 Right upper quadrant pain: Secondary | ICD-10-CM

## 2016-07-25 DIAGNOSIS — R109 Unspecified abdominal pain: Secondary | ICD-10-CM | POA: Diagnosis not present

## 2016-07-25 DIAGNOSIS — N200 Calculus of kidney: Secondary | ICD-10-CM | POA: Insufficient documentation

## 2016-08-06 ENCOUNTER — Ambulatory Visit: Payer: Medicare Other | Admitting: Nurse Practitioner

## 2016-08-13 ENCOUNTER — Other Ambulatory Visit (INDEPENDENT_AMBULATORY_CARE_PROVIDER_SITE_OTHER): Payer: Medicare Other

## 2016-08-13 ENCOUNTER — Encounter: Payer: Self-pay | Admitting: Nurse Practitioner

## 2016-08-13 ENCOUNTER — Encounter (INDEPENDENT_AMBULATORY_CARE_PROVIDER_SITE_OTHER): Payer: Self-pay

## 2016-08-13 ENCOUNTER — Ambulatory Visit (INDEPENDENT_AMBULATORY_CARE_PROVIDER_SITE_OTHER): Payer: Medicare Other | Admitting: Nurse Practitioner

## 2016-08-13 VITALS — BP 112/72 | HR 76 | Ht 59.0 in | Wt 320.2 lb

## 2016-08-13 DIAGNOSIS — R634 Abnormal weight loss: Secondary | ICD-10-CM

## 2016-08-13 DIAGNOSIS — R110 Nausea: Secondary | ICD-10-CM

## 2016-08-13 DIAGNOSIS — R1310 Dysphagia, unspecified: Secondary | ICD-10-CM | POA: Diagnosis not present

## 2016-08-13 LAB — TSH: TSH: 3.14 u[IU]/mL (ref 0.35–4.50)

## 2016-08-13 MED ORDER — RANITIDINE HCL 150 MG PO TABS: 150.0000 mg | ORAL_TABLET | ORAL | 0 refills | Status: DC

## 2016-08-13 MED ORDER — ONDANSETRON HCL 4 MG PO TABS: 4.0000 mg | ORAL_TABLET | Freq: Three times a day (TID) | ORAL | 0 refills | Status: DC | PRN

## 2016-08-13 NOTE — Patient Instructions (Addendum)
Your physician has requested that you go to the basement for the following lab work before leaving today: TSH  We have sent the following medications to your pharmacy for you to pick up at your convenience: Zofran 4 mg every 8 hours as needed Zantac every morning  We will contact you with results of labs and xrays once available.  You have been scheduled to see Tye Savoy in follow up on Tuesday 09/02/16 @ 10:30 am  You have been scheduled for an Upper GI Series at [facility]. Your appointment is on 08/21/16 Thursday at 10:30 am. Please arrive 15 minutes prior to your test for registration. Make sure not to eat or drink anything after midnight on the night before your test. If you need to reschedule, please call radiology at [phone]. ________________________________________________________________ An upper GI series uses x rays to help diagnose problems of the upper GI tract, which includes the esophagus, stomach, and duodenum. The duodenum is the first part of the small intestine. An upper GI series is conducted by a radiology technologist or a radiologist-a doctor who specializes in x-ray imaging-at a hospital or outpatient center. While sitting or standing in front of an x-ray machine, the patient drinks barium liquid, which is often white and has a chalky consistency and taste. The barium liquid coats the lining of the upper GI tract and makes signs of disease show up more clearly on x rays. X-ray video, called fluoroscopy, is used to view the barium liquid moving through the esophagus, stomach, and duodenum. Additional x rays and fluoroscopy are performed while the patient lies on an x-ray table. To fully coat the upper GI tract with barium liquid, the technologist or radiologist may press on the abdomen or ask the patient to change position. Patients hold still in various positions, allowing the technologist or radiologist to take x rays of the upper GI tract at different  angles. If a technologist conducts the upper GI series, a radiologist will later examine the images to look for problems.  This test typically takes about 1 hour to complete. __________________________________________________________________ If you are age 48 or older, your body mass index should be between 23-30. Your Body mass index is 64.68 kg/m. If this is out of the aforementioned range listed, please consider follow up with your Primary Care Provider.  If you are age 48 or younger, your body mass index should be between 19-25. Your Body mass index is 64.68 kg/m. If this is out of the aformentioned range listed, please consider follow up with your Primary Care Provider.

## 2016-08-13 NOTE — Progress Notes (Signed)
HPI: Patient is a 46 yo female, new to this practice and here for evaluation of abdominal pain. She describes epigastric pain which started mid Feb. Pain was non-radiating, mainly postprandial..  Took antacids and pain would resolve in about an hour. Saw PCP, told liver studies were abnormal. U/S done, showed only fatty liver.  No NSAID use. Patient had been taking Prilosec for a year but stopped it shortly after the epigastric pain started. She read that PPIs could actually cause abdominal pain. Since stopping PPI the pain has significantly improved. Now she is mainly just nauseated.  No new medications. Not pregnant, had hysterectomy.  She reports an unexpected weight loss of 75 pounds since October. She tries to reduce food intake and takes an OTC diet pill with apple cider but has done these things off and on for years without any drastic weight loss.  Her BMs are at baseline which is solid to loose depending on diet. No rectal bleeding.  She does described problems swallowing pills and very occasional solid food dysphagia.   Past Medical History:  Diagnosis Date  . Hypoglycemia   . Obesity   . Thyroid cancer ([place])   . Thyroid disease     Past Surgical History:  Procedure Laterality Date  . HEEL SPUR SURGERY Left   . LITHOTRIPSY    . PARTIAL HYSTERECTOMY    . rotator cuff surgery Bilateral   . THYROIDECTOMY    . TONSILLECTOMY AND ADENOIDECTOMY     Family History  Problem Relation Age of Onset  . Ovarian cancer Mother   . Colon cancer Neg Hx   . Esophageal cancer Neg Hx   . Pancreatic cancer Neg Hx   . Stomach cancer Neg Hx   . Liver disease Neg Hx    Social History  Substance Use Topics  . Smoking status: Never Smoker  . Smokeless tobacco: Never Used  . Alcohol use No   Current Outpatient Prescriptions  Medication Sig Dispense Refill  . furosemide (LASIX) 20 MG tablet Take 10 mg by mouth daily.     . Homeopathic Products (ARTHRITIS RELIEF PO) Take 1 tablet by mouth  daily.     Marland Kitchen levothyroxine (SYNTHROID, LEVOTHROID) 137 MCG tablet Take 225 mcg by mouth daily.     Marland Kitchen lisinopril (PRINIVIL,ZESTRIL) 5 MG tablet Take 5 mg by mouth daily.    Marland Kitchen LITHOBID 300 MG CR tablet at bedtime.     Marland Kitchen LORazepam (ATIVAN) 0.5 MG tablet Take 0.5 mg by mouth as needed. For anxiety.     . Multiple Vitamin (MULTIVITAMIN WITH MINERALS) TABS Take 1 tablet by mouth daily.    Marland Kitchen MYRBETRIQ 50 MG TB24 tablet daily.    Marland Kitchen OVER THE COUNTER MEDICATION Take 2 capsules by mouth daily. Lipozene    . potassium chloride (MICRO-K) 10 MEQ CR capsule daily.     . VESICARE 10 MG tablet      No current facility-administered medications for this visit.    Allergies  Allergen Reactions  . Morphine And Related Nausea And Vomiting    Hallucinations   . Oxycodone Nausea And Vomiting    Hallucinations        Review of Systems: All systems reviewed and negative except where noted in HPI    Physical Exam: BP 112/72   Pulse 76   Ht 4\' 11"  (1.499 m)   Wt (!) 320 lb 4 oz (145.3 kg)   BMI 64.68 kg/m  Constitutional:  Morbidly obese white  female in no acute distress. Psychiatric: Normal mood and affect. Behavior is normal. HEENT:  Conjunctivae are normal. No scleral icterus. Neck supple.  Cardiovascular: Normal rate, regular rhythm.  Pulmonary/chest: Effort normal and breath sounds normal. No wheezing, rales or rhonchi. Abdominal: Morbidly obese, nontender. Bowel sounds active throughout.  Lymphadenopathy: No cervical adenopathy noted. Neurological: Alert and oriented to person place and time. Skin: Skin is warm and dry. No rashes noted.   ASSESSMENT AND PLAN:  1. 46 yo female with several week hx of nausea. Not pregnant. Initially accompanied by epigastric pain which resolved after stopping chronic PPI. She reports dysphagia though mainly just to pills. More noteworthy is her report of a 75 pound weight loss since October.  -Due to BMI patient would need to have any endoscopic procedures  done at the hospital. In the interest of time I would like to start with an UGI series to rule out any gross lesions / esophageal strictures.   2. Mild transaminitis, fatty liver on ultrasound.  ALT 52, AST 42.   She is morbidly obese with hypertriglyceridemia. LFTs minimally elevated but were previously normal.  -recommend follow up LFTs in 3 months.   3. Unexplained weight loss . Since last October she has lost 75 pounds. Still morbidly obesity. Takes a diet pill and cuts back meals but has done this many times before without such drastic weight loss.  -check TSH -await UGI . Further recommendation to follow results. Recent albumin was normal.  -normal hgb  4. Arthritis, takes homeopathic agent cherry tart  5. Manic depressive, on lithobid. LFTs have been monitored and normal except recent mild transaminitis  6. Hypothyroidism, acquired.Marland Kitchen Hx of  thyroid cancer , s/p thyroidectomy years ago. On synthroid  Tye Savoy, NP  08/13/2016, 1:42 PM

## 2016-08-15 ENCOUNTER — Ambulatory Visit: Payer: Medicare Other | Admitting: Endocrinology

## 2016-08-15 ENCOUNTER — Encounter: Payer: Medicare Other | Admitting: Gastroenterology

## 2016-08-15 NOTE — Progress Notes (Signed)
Reviewed and agree with initial management plan.  Dalessandro Baldyga T. Gleb Mcguire, MD FACG 

## 2016-08-20 ENCOUNTER — Telehealth: Payer: Self-pay | Admitting: Nurse Practitioner

## 2016-08-20 NOTE — Telephone Encounter (Signed)
Advised of her TSH level. She will also receive a copy through the mail.

## 2016-08-21 ENCOUNTER — Ambulatory Visit (HOSPITAL_COMMUNITY): Payer: Medicare Other

## 2016-08-27 ENCOUNTER — Ambulatory Visit (HOSPITAL_COMMUNITY)
Admission: RE | Admit: 2016-08-27 | Discharge: 2016-08-27 | Disposition: A | Discharge status: Home / Self Care | Payer: Medicare Other | Source: Ambulatory Visit | Attending: Nurse Practitioner | Admitting: Nurse Practitioner

## 2016-08-27 DIAGNOSIS — R110 Nausea: Secondary | ICD-10-CM | POA: Insufficient documentation

## 2016-08-27 DIAGNOSIS — R634 Abnormal weight loss: Secondary | ICD-10-CM | POA: Insufficient documentation

## 2016-08-27 DIAGNOSIS — R1310 Dysphagia, unspecified: Secondary | ICD-10-CM | POA: Insufficient documentation

## 2016-09-02 ENCOUNTER — Ambulatory Visit: Payer: Medicare Other | Admitting: Nurse Practitioner

## 2016-09-02 DIAGNOSIS — E890 Postprocedural hypothyroidism: Secondary | ICD-10-CM | POA: Diagnosis not present

## 2016-09-02 DIAGNOSIS — M7989 Other specified soft tissue disorders: Secondary | ICD-10-CM | POA: Diagnosis not present

## 2016-09-02 DIAGNOSIS — R300 Dysuria: Secondary | ICD-10-CM | POA: Diagnosis not present

## 2016-09-02 DIAGNOSIS — M25531 Pain in right wrist: Secondary | ICD-10-CM | POA: Diagnosis not present

## 2016-09-02 DIAGNOSIS — S6991XA Unspecified injury of right wrist, hand and finger(s), initial encounter: Secondary | ICD-10-CM | POA: Diagnosis not present

## 2016-09-04 ENCOUNTER — Ambulatory Visit (INDEPENDENT_AMBULATORY_CARE_PROVIDER_SITE_OTHER): Payer: Medicare Other | Admitting: Gastroenterology

## 2016-09-04 ENCOUNTER — Encounter: Payer: Self-pay | Admitting: Gastroenterology

## 2016-09-04 VITALS — BP 120/64 | HR 80 | Ht 59.5 in | Wt 327.6 lb

## 2016-09-04 DIAGNOSIS — R110 Nausea: Secondary | ICD-10-CM | POA: Diagnosis not present

## 2016-09-04 DIAGNOSIS — R1310 Dysphagia, unspecified: Secondary | ICD-10-CM | POA: Diagnosis not present

## 2016-09-04 MED ORDER — PROMETHAZINE HCL 12.5 MG PO TABS: 12.5000 mg | ORAL_TABLET | Freq: Three times a day (TID) | ORAL | 1 refills | Status: DC | PRN

## 2016-09-04 MED ORDER — OMEPRAZOLE 20 MG PO CPDR: 20.0000 mg | DELAYED_RELEASE_CAPSULE | Freq: Every day | ORAL | 2 refills | Status: DC

## 2016-09-04 NOTE — Progress Notes (Signed)
09/04/2016 Megan Moreno [id number] 05/30/1970   HISTORY OF PRESENT ILLNESS:  This is a 46 year old female who is here for follow-up of her visit on April 25 with Tye Savoy. At that time she was seen for complaints of dysphagia, nausea, upper abdominal pain, and 75 pound weight loss. Upper GI series was ordered and was unremarkable. She was placed on ranitidine 150 mg daily and Zofran as needed for nausea.  She has gained 7 pounds since her last visit here 4 weeks ago. She thinks that the Zantac is causing her weight gain and would like to decrease that dose or switch to something different. She is also complaining of constipation and is blaming that on the Zofran so she is asking for something else in place of that. She says that Zofran does seem to work and Zantac has been helping, but does not want to deal with these to side effects. Still complains of difficulty swallowing some pills and occasionally certain foods. This seems to occur more in her throat, oropharyngeal area.  Also was commented about her elevated LFTs. This is been a recent mild elevation. She is on lithium and LFTs are monitored regularly. Does have fatty liver.   Past Medical History:  Diagnosis Date  . Hypoglycemia   . Obesity   . Thyroid cancer ([place])   . Thyroid disease    Past Surgical History:  Procedure Laterality Date  . HEEL SPUR SURGERY Left   . LITHOTRIPSY    . PARTIAL HYSTERECTOMY    . rotator cuff surgery Bilateral   . THYROIDECTOMY    . TONSILLECTOMY AND ADENOIDECTOMY      reports that she has never smoked. She has never used smokeless tobacco. She reports that she does not drink alcohol or use drugs. family history includes Ovarian cancer in her mother. Allergies  Allergen Reactions  . Morphine And Related Nausea And Vomiting    Hallucinations   . Oxycodone Nausea And Vomiting    Hallucinations         Outpatient Encounter Prescriptions as of 09/04/2016  Medication Sig  .  furosemide (LASIX) 20 MG tablet Take 10 mg by mouth daily.   . Homeopathic Products (ARTHRITIS RELIEF PO) Take 1 tablet by mouth daily.   Marland Kitchen levothyroxine (SYNTHROID, LEVOTHROID) 137 MCG tablet Take 225 mcg by mouth daily.   Marland Kitchen lisinopril (PRINIVIL,ZESTRIL) 5 MG tablet Take 5 mg by mouth daily.  Marland Kitchen LITHOBID 300 MG CR tablet at bedtime.   Marland Kitchen LORazepam (ATIVAN) 0.5 MG tablet Take 0.5 mg by mouth as needed. For anxiety.   . Multiple Vitamin (MULTIVITAMIN WITH MINERALS) TABS Take 1 tablet by mouth daily.  Marland Kitchen MYRBETRIQ 50 MG TB24 tablet daily.  Marland Kitchen OVER THE COUNTER MEDICATION Take 2 capsules by mouth daily. Lipozene  . potassium chloride (MICRO-K) 10 MEQ CR capsule daily.   . VESICARE 10 MG tablet   . [DISCONTINUED] ondansetron (ZOFRAN) 4 MG tablet Take 1 tablet (4 mg total) by mouth every 8 (eight) hours as needed for nausea or vomiting.  . [DISCONTINUED] ranitidine (ZANTAC) 150 MG tablet Take 1 tablet (150 mg total) by mouth every morning.  Marland Kitchen omeprazole (PRILOSEC) 20 MG capsule Take 1 capsule (20 mg total) by mouth daily.  . promethazine (PHENERGAN) 12.5 MG tablet Take 1 tablet (12.5 mg total) by mouth every 8 (eight) hours as needed for nausea or vomiting.   No facility-administered encounter medications on file as of 09/04/2016.      REVIEW OF  SYSTEMS  : All other systems reviewed and negative except where noted in the History of Present Illness.   PHYSICAL EXAM: BP 120/64   Pulse 80   Ht 4' 11.5" (1.511 m)   Wt (!) 327 lb 9.6 oz (148.6 kg)   BMI 65.06 kg/m  General: Well developed white female in no acute distress; morbidly obese Head: Normocephalic and atraumatic Eyes:  Sclerae anicteric, conjunctiva pink. Ears: Normal auditory acuity Neck: Supple, no masses.  Lungs: Clear throughout to auscultation; no increased WOB. Heart: Regular rate and rhythm Abdomen: Soft, obese, non-distended.  BS present.  Non-tender. Musculoskeletal: Symmetrical with no gross deformities  Skin: No lesions on  visible extremities Extremities: No edema  Neurological: Alert oriented x 4, grossly non-focal Psychological:  Alert and cooperative. Normal mood and affect  ASSESSMENT AND PLAN: -Dysphagia, nausea, epigastric pain:  UGI unremarkable.  Her dysphagia is mostly to pill and to food occasionally.  Seems more oropharyngeal in nature.  Will schedule a MBSS.  She thinks that the Zantac is causing her to gain weight since she would like to try to change that. He is willing to try omeprazole 20 mg daily. We'll prescribe this. Also complaining of Zofran causes her constipation says asking for something else for nausea. Will try Phenergan 12.5 mg every 8 hours as needed. -Mild transaminitis, fatty liver on ultrasound.  ALT 52, AST 42.   She is morbidly obese with hypertriglyceridemia. LFTs minimally elevated but were previously normal.  Will monitor for now. -Manic depressive, on lithobid. LFTs have been monitored and normal except recent mild transaminitis -Hypothyroidism, acquired.Marland Kitchen Hx of  thyroid cancer , s/p thyroidectomy years ago. On synthroid  *Will have her follow-up in approximately 4 more weeks.  CC:  Merrilee Seashore, MD

## 2016-09-04 NOTE — Patient Instructions (Addendum)
.  You have been scheduled for a modified barium swallow on 09/11/16 at 100 pm . Please arrive 15 minutes prior to your test for registration. You will go to [facility] (1st Floor) for your appointment. Please refrain from eating or drinking anything 4 hours prior to your test. Should you need to cancel or reschedule your appointment, please contact [phone] [facility]) or [phone] [place]). _____________________________________________________________________ A Modified Barium Swallow Study, or MBS, is a special x-ray that is taken to check swallowing skills. It is carried out by a Stage manager and a Psychologist, clinical (SLP). During this test, yourmouth, throat, and esophagus, a muscular tube which connects your mouth to your stomach, is checked. The test will help you, your doctor, and the SLP plan what types of foods and liquids are easier for you to swallow. The SLP will also identify positions and ways to help you swallow more easily and safely. What will happen during an MBS? You will be taken to an x-ray room and seated comfortably. You will be asked to swallow small amounts of food and liquid mixed with barium. Barium is a liquid or paste that allows images of your mouth, throat and esophagus to be seen on x-ray. The x-ray captures moving images of the food you are swallowing as it travels from your mouth through your throat and into your esophagus. This test helps identify whether food or liquid is entering your lungs (aspiration). The test also shows which part of your mouth or throat lacks strength or coordination to move the food or liquid in the right direction. This test typically takes 30 minutes to 1 hour to complete. ______________________________________________________________________  We have sent the following medications to your pharmacy for you to pick up at your convenience: Phenergan Omeprazole  Follow up appointment with Tye Savoy, NP on October 07, 2016 at 300 pm.  Thank you for choosing me and [facility].  Alonza Bogus, PA-C

## 2016-09-05 ENCOUNTER — Other Ambulatory Visit (HOSPITAL_COMMUNITY): Payer: Self-pay | Admitting: Gastroenterology

## 2016-09-05 ENCOUNTER — Ambulatory Visit (INDEPENDENT_AMBULATORY_CARE_PROVIDER_SITE_OTHER): Payer: Medicare Other | Admitting: Endocrinology

## 2016-09-05 ENCOUNTER — Encounter: Payer: Self-pay | Admitting: Endocrinology

## 2016-09-05 DIAGNOSIS — C73 Malignant neoplasm of thyroid gland: Secondary | ICD-10-CM

## 2016-09-05 DIAGNOSIS — Z85850 Personal history of malignant neoplasm of thyroid: Secondary | ICD-10-CM | POA: Insufficient documentation

## 2016-09-05 DIAGNOSIS — R1319 Other dysphagia: Secondary | ICD-10-CM

## 2016-09-05 LAB — T4, FREE: Free T4: 1.51 ng/dL (ref 0.60–1.60)

## 2016-09-05 LAB — TSH: TSH: 2.38 u[IU]/mL (ref 0.35–4.50)

## 2016-09-05 NOTE — Progress Notes (Signed)
Subjective:    Patient ID: Megan Moreno, female    DOB: Jul 14, 1970, 46 y.o.   MRN: [id number]  HPI Pt is referred by Dr Ashby Dawes, for thyroid cancer.  Pt had thyroidectomy for thyroid cancer in 2004.  She had 2 doses of adjuvant RAI. she has been on prescribed thyroid hormone therapy since surgery.  she has never taken kelp or any other type of non-prescribed thyroid product.  she has never had XRT to the neck.  She says the synthroid causes her to not feel well.  She has slight solid dysphagia in the neck, but no assoc pain.  GI f/u is scheduled.   Past Medical History:  Diagnosis Date  . Hypoglycemia   . Obesity   . Thyroid cancer ([place])   . Thyroid disease     Past Surgical History:  Procedure Laterality Date  . HEEL SPUR SURGERY Left   . LITHOTRIPSY    . PARTIAL HYSTERECTOMY    . rotator cuff surgery Bilateral   . THYROIDECTOMY    . TONSILLECTOMY AND ADENOIDECTOMY      Social History   Social History  . Marital status: Single    Spouse name: N/A  . Number of children: N/A  . Years of education: N/A   Occupational History  . Not on file.   Social History Main Topics  . Smoking status: Never Smoker  . Smokeless tobacco: Never Used  . Alcohol use No  . Drug use: No  . Sexual activity: No   Other Topics Concern  . Not on file   Social History Narrative  . No narrative on file    Current Outpatient Prescriptions on File Prior to Visit  Medication Sig Dispense Refill  . furosemide (LASIX) 20 MG tablet Take 10 mg by mouth daily.     . Homeopathic Products (ARTHRITIS RELIEF PO) Take 1 tablet by mouth daily.     Marland Kitchen levothyroxine (SYNTHROID, LEVOTHROID) 137 MCG tablet Take 225 mcg by mouth daily.     Marland Kitchen lisinopril (PRINIVIL,ZESTRIL) 5 MG tablet Take 5 mg by mouth daily.    Marland Kitchen LORazepam (ATIVAN) 0.5 MG tablet Take 0.5 mg by mouth as needed. For anxiety.     . Multiple Vitamin (MULTIVITAMIN WITH MINERALS) TABS Take 1 tablet by mouth daily.    Marland Kitchen MYRBETRIQ 50 MG  TB24 tablet daily.    Marland Kitchen omeprazole (PRILOSEC) 20 MG capsule Take 1 capsule (20 mg total) by mouth daily. 30 capsule 2  . OVER THE COUNTER MEDICATION Take 2 capsules by mouth daily. Lipozene    . potassium chloride (MICRO-K) 10 MEQ CR capsule daily.     . promethazine (PHENERGAN) 12.5 MG tablet Take 1 tablet (12.5 mg total) by mouth every 8 (eight) hours as needed for nausea or vomiting. 30 tablet 1  . VESICARE 10 MG tablet     . LITHOBID 300 MG CR tablet at bedtime.      No current facility-administered medications on file prior to visit.     Allergies  Allergen Reactions  . Morphine And Related Nausea And Vomiting    Hallucinations   . Oxycodone Nausea And Vomiting    Hallucinations       Family History  Problem Relation Age of Onset  . Ovarian cancer Mother   . Colon cancer Neg Hx   . Esophageal cancer Neg Hx   . Pancreatic cancer Neg Hx   . Stomach cancer Neg Hx   . Liver disease Neg Hx   .  Thyroid cancer Neg Hx   . Thyroid disease Neg Hx     BP 126/64 (BP Location: Left Arm, Patient Position: Sitting)   Pulse 82   Ht 4' 11.5" (1.511 m)   Wt (!) 328 lb (148.8 kg)   SpO2 97%   BMI 65.14 kg/m     Review of Systems denies headache, hoarseness, diplopia, palpitations, sob, diarrhea, polyuria, muscle weakness, excessive diaphoresis, tremor, heat intolerance, easy bruising, and rhinorrhea. No neck swelling.  She has regained weight.  Anxiety is well-controlled.      Objective:   Physical Exam VS: see vs page GEN: no distress.  Morbid obesity HEAD: head: no deformity eyes: no periorbital swelling, no proptosis external nose and ears are normal mouth: no lesion seen NECK: a healed scar is present.  I do not appreciate a nodule in the thyroid or elsewhere in the neck.  CHEST WALL: no deformity LUNGS: clear to auscultation CV: reg rate and rhythm, no murmur ABD: abdomen is soft, nontender.  no hepatosplenomegaly.  not distended.  no hernia MUSCULOSKELETAL: muscle  bulk and strength are grossly normal.  no obvious joint swelling.  gait is normal and steady.  Right wrist is in a brace.  EXTEMITIES: no deformity.  no ulcer on the feet.  feet are of normal color and temp.  1+ bilat leg edema PULSES: dorsalis pedis intact bilat.  no carotid bruit NEURO:  cn 2-12 grossly intact.   readily moves all 4's.  sensation is intact to touch on the feet SKIN:  Normal texture and temperature.  No rash or suspicious lesion is visible.   NODES:  None palpable at the neck PSYCH: alert, well-oriented.  Does not appear anxious nor depressed.    Lab Results  Component Value Date   TSH 3.14 08/13/2016   Thyroid pathol (2004): PAPILLARY MICROCARCINOMA, FOLLICULAR VARIANT, 0.5 CM. - HYPERPLASTIC NODULE, 5.2 CM. - MULTINODULAR GOITER WITH MULTIPLE HYPERPLASTIC AND ADENOMATOUS NODULES. - HASHIMOTO THYROIDITIS. - MARGINS NEGATIVE.  2. THYROID, RIGHT LOBECTOMY: - MULTIFOCAL PAPILLARY CARCINOMA, FOLLICULAR VARIANT, 0.2, 0.8, AND 1.1 CM. - MULTINODULAR GOITER WITH HYPERPLASTIC NODULES. - HASHIMOTO THYROIDITIS. - MARGINS NEGATIVE.   I have reviewed outside records, and summarized: Pt was noted to have h/o thyroid cancer, and referred here.  She was seen by GI, and esophageal dilation is planned.     Assessment & Plan:  Stage 1 papillary adenocarcinoma of the thyroid. No clinical evidence of recurrence.   Postsurgical hypothyroidism, well-replaced.   Dysphagia, new to me.  Not thyroid-related.    Patient Instructions  blood tests are requested for you today.  We'll let you know about the results.  Please return in 1 year.

## 2016-09-05 NOTE — Patient Instructions (Addendum)
blood tests are requested for you today.  We'll let you know about the results.   Please return in 1 year.  

## 2016-09-07 NOTE — Progress Notes (Signed)
Reviewed and agree with initial management plan.  Lakely Elmendorf T. Jamin Panther, MD FACG 

## 2016-09-08 LAB — THYROGLOBULIN LEVEL: Thyroglobulin: 0.1 ng/mL — ABNORMAL LOW

## 2016-09-08 LAB — THYROGLOBULIN ANTIBODY: Thyroglobulin Ab: <1 IU/mL (ref <2)

## 2016-09-09 ENCOUNTER — Telehealth: Payer: Self-pay

## 2016-09-09 NOTE — Telephone Encounter (Signed)
Attempted to reach the patient and advise on recent labs. Patient was not available, will try again at a later time.

## 2016-09-10 ENCOUNTER — Ambulatory Visit (HOSPITAL_COMMUNITY): Payer: Medicare Other

## 2016-09-11 ENCOUNTER — Ambulatory Visit (HOSPITAL_COMMUNITY)
Admission: RE | Admit: 2016-09-11 | Discharge: 2016-09-11 | Disposition: A | Discharge status: Home / Self Care | Payer: Medicare Other | Source: Ambulatory Visit | Attending: Gastroenterology | Admitting: Gastroenterology

## 2016-09-11 DIAGNOSIS — R1310 Dysphagia, unspecified: Secondary | ICD-10-CM | POA: Insufficient documentation

## 2016-09-11 DIAGNOSIS — R110 Nausea: Secondary | ICD-10-CM

## 2016-09-11 DIAGNOSIS — R1319 Other dysphagia: Secondary | ICD-10-CM

## 2016-09-12 NOTE — Telephone Encounter (Signed)
Notes recorded by Renato Shin, MD on 09/08/2016 at 4:52 PM EDT please call patient: No cancer is found--good I'll see you next time.  Patient notified of message and voiced understanding.

## 2016-10-07 ENCOUNTER — Encounter: Payer: Self-pay | Admitting: Nurse Practitioner

## 2016-10-07 ENCOUNTER — Ambulatory Visit (INDEPENDENT_AMBULATORY_CARE_PROVIDER_SITE_OTHER): Payer: Medicare Other | Admitting: Nurse Practitioner

## 2016-10-07 DIAGNOSIS — R1310 Dysphagia, unspecified: Secondary | ICD-10-CM | POA: Diagnosis not present

## 2016-10-07 DIAGNOSIS — R110 Nausea: Secondary | ICD-10-CM

## 2016-10-07 MED ORDER — OMEPRAZOLE 20 MG PO CPDR: 20.0000 mg | DELAYED_RELEASE_CAPSULE | Freq: Every day | ORAL | 2 refills | Status: DC

## 2016-10-07 NOTE — Progress Notes (Signed)
     HPI:  Patient is a 46 year female who I saw several weeks ago as a new patient to the practice. She came with multiple GI issues but was most bothered by post-prandial nausea, weight loss. She complained of dysphagia but only to pills. Started on Zantac and zofran. UGI series obtained. Normal esophagus. Tablet passed into stomach.  Imaging of the stomach was limited by body habitus but gastric emptying was prompt. No pyloric, duodenum C-loop and ligament of Treitz abnormalites.   Seen in follow up in May by Alonza Bogus, P.A. The Zofran caused constipation, she was changed to Phenergan.  She had gained 7 pounds,  thought Zantac caused it so we changed her to Omeprazole. Upper abdominal pain better since starting omeprazole.  Most of the time the nausea is mild, only about once a week does it warrant an anti-emetic.  No further weight gain, she is stable at 326 pounds. For persistent dysphagia Alonza Bogus, P.A. Sent patient for MBSS which was unremarkable.   Shamanda continues to have problems swallowing. Pills most problematic but sometimes vegetables problematic as well, especially celery. Corn and rice okay. Dr. Velora Heckler apparenty dilated her esophagus many years ago for same complaints and she felt better for many years.      Past Medical History:  Diagnosis Date  . Hypoglycemia   . Obesity   . Thyroid cancer ([place])   . Thyroid disease     Patient's surgical history, family medical history, social history, medications and allergies were all reviewed in Epic    Physical Exam: BP 120/86   Pulse 78   Ht 4' 11.5" (1.511 m)   Wt (!) 326 lb (147.9 kg)   BMI 64.74 kg/m   GENERAL: morbidly obese white female in NAD PSYCH: : cooperative, normal affect EENT:  conjunctiva pink, mucous membranes moist, neck supple without masses CARDIAC:  RRR, no murmur heard, no peripheral edema PULM: Normal respiratory effort, lungs CTA bilaterally, no wheezing ABDOMEN:  soft, obese, non-tender, normal  bowel sounds SKIN:  turgor, no lesions seen NEURO: Alert and oriented x 3, no focal neurologic deficits   ASSESSMENT and PLAN:  1. 46 yo female with chronic nausea. No vomiting or other symptoms suggestive of underlying organic disease. She is not diabetic. No narcotic use. No NSAID use. Idiopathic delayed gastric emptying vrs functional etiology. Symptoms have improved but not resolved with trial of PPI and anti-emetics. UGI series unremarkable.  Normal appearing gallbladder on ultrasound in April  -Will proceed with EGD to be done at hospital. The risks and benefits of EGD were discussed and the patient agrees to proceed.  -if EGD negative consider gastric emptying study.   2. Dysphagia, mainly to pills but occasionally solids. Esophogram and MBSS negative. Per patient she got relief after esophageal dilation by Dr. Velora Heckler many years ago. (can't locate report).  -consider empirical dilation at time of EGD.    3. Morbid obesity. BMI 64  4. Hypothyroidism, acquired. Remote thyroidectomy for cancer  5. Bipolar disorder, on Lithobid  6. Probable fatty liver by U/S.   Tye Savoy , NP 10/07/2016, 4:05 PM

## 2016-10-07 NOTE — Patient Instructions (Addendum)
If you are age 48 or older, your body mass index should be between 23-30. Your Body mass index is 64.74 kg/m. If this is out of the aforementioned range listed, please consider follow up with your Primary Care Provider.  If you are age 33 or younger, your body mass index should be between 19-25. Your Body mass index is 64.74 kg/m. If this is out of the aformentioned range listed, please consider follow up with your Primary Care Provider.   You have been scheduled for a gastric emptying scan at [facility] on 10/21/16 at 730 am. Please arrive at least 15 minutes prior to your appointment for registration. Please make certain not to have anything to eat or drink after midnight the night before your test. Hold all stomach medications (ex: Zofran, phenergan, Reglan) 8 hours prior to your test. If you need to reschedule your appointment, please contact radiology scheduling at [phone]. _____________________________________________________________________ A gastric-emptying study measures how long it takes for food to move through your stomach. There are several ways to measure stomach emptying. In the most common test, you eat food that contains a small amount of radioactive material. A scanner that detects the movement of the radioactive material is placed over your abdomen to monitor the rate at which food leaves your stomach. This test normally takes about 4 hours to complete. _____________________________________________________________________    We have sent the following medications to your pharmacy for you to pick up at your convenience: Omeprazole.  Take 30 minutes before breakfast.  Thank you for choosing me and [facility].   Tye Savoy, NP

## 2016-10-08 ENCOUNTER — Telehealth: Payer: Self-pay

## 2016-10-08 DIAGNOSIS — R110 Nausea: Secondary | ICD-10-CM

## 2016-10-08 DIAGNOSIS — R1310 Dysphagia, unspecified: Secondary | ICD-10-CM

## 2016-10-08 NOTE — Telephone Encounter (Signed)
Patient called and canceled Gastric Emptying Scan.  She states she doesn't want to have this done. That she wants her esophagus stretched. I explained to her that this study would be helpful regarding the nausea she is having but she stated she does not want to be a [place] pig and wants to cancel Gastric Study and just have EGD.  Please advise. Thank Peter [place]

## 2016-10-09 NOTE — Progress Notes (Signed)
Reviewed and agree with initial management plan.  Fredick Schlosser T. Jaleeyah Munce, MD FACG 

## 2016-10-10 NOTE — Telephone Encounter (Signed)
Patient advised that there are NO spots for July or August for EGD at the hospital with Dr.Stark.  Will be put on September schedule as soon as its available.

## 2016-10-10 NOTE — Telephone Encounter (Signed)
-----   Message from Lowell Guitar, [place] sent at 10/09/2016 12:02 PM EDT ----- Regarding: endoscopy Schedule EGD with Dr Pequignot Plan when opening available on [place]

## 2016-10-21 ENCOUNTER — Encounter (HOSPITAL_COMMUNITY): Payer: Medicare Other

## 2016-10-23 ENCOUNTER — Other Ambulatory Visit: Payer: Self-pay

## 2016-10-23 DIAGNOSIS — R110 Nausea: Secondary | ICD-10-CM

## 2016-10-23 DIAGNOSIS — R1310 Dysphagia, unspecified: Secondary | ICD-10-CM

## 2016-10-23 NOTE — Addendum Note (Signed)
Addended by: Candie Mile on: 10/23/2016 02:39 PM   Modules accepted: Orders

## 2016-10-23 NOTE — Telephone Encounter (Signed)
EGD scheduled at [facility] 12/23/16 at 830 am with Dr. Millward Plan.  Patient advised of date and time of procedure.  Verbally went over prep instructions with patient.  Procedure scheduled with [facility] case number [id number].

## 2016-10-23 NOTE — Telephone Encounter (Signed)
-----   Message from Lowell Guitar, [place] sent at 10/09/2016 12:02 PM EDT ----- Regarding: endoscopy Schedule EGD with Dr Dieppa Plan when opening available on [place]

## 2016-10-28 DIAGNOSIS — M654 Radial styloid tenosynovitis [de Quervain]: Secondary | ICD-10-CM | POA: Diagnosis not present

## 2016-12-05 DIAGNOSIS — M654 Radial styloid tenosynovitis [de Quervain]: Secondary | ICD-10-CM | POA: Diagnosis not present

## 2016-12-10 ENCOUNTER — Encounter (HOSPITAL_COMMUNITY): Payer: Self-pay | Admitting: *Deleted

## 2016-12-11 DIAGNOSIS — Z79899 Other long term (current) drug therapy: Secondary | ICD-10-CM | POA: Diagnosis not present

## 2016-12-11 DIAGNOSIS — Z0000 Encounter for general adult medical examination without abnormal findings: Secondary | ICD-10-CM | POA: Diagnosis not present

## 2016-12-11 DIAGNOSIS — I10 Essential (primary) hypertension: Secondary | ICD-10-CM | POA: Diagnosis not present

## 2016-12-11 DIAGNOSIS — E782 Mixed hyperlipidemia: Secondary | ICD-10-CM | POA: Diagnosis not present

## 2016-12-11 DIAGNOSIS — E890 Postprocedural hypothyroidism: Secondary | ICD-10-CM | POA: Diagnosis not present

## 2016-12-11 DIAGNOSIS — E1165 Type 2 diabetes mellitus with hyperglycemia: Secondary | ICD-10-CM | POA: Diagnosis not present

## 2016-12-16 DIAGNOSIS — C73 Malignant neoplasm of thyroid gland: Secondary | ICD-10-CM | POA: Diagnosis not present

## 2016-12-16 DIAGNOSIS — E782 Mixed hyperlipidemia: Secondary | ICD-10-CM | POA: Diagnosis not present

## 2016-12-16 DIAGNOSIS — I10 Essential (primary) hypertension: Secondary | ICD-10-CM | POA: Diagnosis not present

## 2016-12-16 DIAGNOSIS — Z0000 Encounter for general adult medical examination without abnormal findings: Secondary | ICD-10-CM | POA: Diagnosis not present

## 2016-12-18 ENCOUNTER — Other Ambulatory Visit: Payer: Self-pay | Admitting: Gastroenterology

## 2016-12-23 ENCOUNTER — Encounter (HOSPITAL_COMMUNITY)
Admission: RE | Disposition: A | Discharge status: Home / Self Care | Payer: Self-pay | Source: Ambulatory Visit | Attending: Gastroenterology

## 2016-12-23 ENCOUNTER — Ambulatory Visit (HOSPITAL_COMMUNITY): Payer: Medicare Other | Admitting: Anesthesiology

## 2016-12-23 ENCOUNTER — Encounter (HOSPITAL_COMMUNITY): Payer: Self-pay | Admitting: *Deleted

## 2016-12-23 ENCOUNTER — Ambulatory Visit (HOSPITAL_COMMUNITY)
Admission: RE | Admit: 2016-12-23 | Discharge: 2016-12-23 | Disposition: A | Discharge status: Home / Self Care | Payer: Medicare Other | Source: Ambulatory Visit | Attending: Gastroenterology | Admitting: Gastroenterology

## 2016-12-23 DIAGNOSIS — Z6841 Body Mass Index (BMI) 40.0 and over, adult: Secondary | ICD-10-CM | POA: Diagnosis not present

## 2016-12-23 DIAGNOSIS — K3189 Other diseases of stomach and duodenum: Secondary | ICD-10-CM | POA: Diagnosis not present

## 2016-12-23 DIAGNOSIS — R634 Abnormal weight loss: Secondary | ICD-10-CM

## 2016-12-23 DIAGNOSIS — F319 Bipolar disorder, unspecified: Secondary | ICD-10-CM | POA: Diagnosis not present

## 2016-12-23 DIAGNOSIS — K2950 Unspecified chronic gastritis without bleeding: Secondary | ICD-10-CM

## 2016-12-23 DIAGNOSIS — R1310 Dysphagia, unspecified: Secondary | ICD-10-CM

## 2016-12-23 DIAGNOSIS — C73 Malignant neoplasm of thyroid gland: Secondary | ICD-10-CM | POA: Diagnosis not present

## 2016-12-23 DIAGNOSIS — R110 Nausea: Secondary | ICD-10-CM

## 2016-12-23 DIAGNOSIS — Z85850 Personal history of malignant neoplasm of thyroid: Secondary | ICD-10-CM | POA: Diagnosis not present

## 2016-12-23 DIAGNOSIS — E039 Hypothyroidism, unspecified: Secondary | ICD-10-CM | POA: Diagnosis not present

## 2016-12-23 DIAGNOSIS — E890 Postprocedural hypothyroidism: Secondary | ICD-10-CM | POA: Diagnosis not present

## 2016-12-23 HISTORY — DX: Hypothyroidism, unspecified: E03.9

## 2016-12-23 HISTORY — DX: Anxiety disorder, unspecified: F41.9

## 2016-12-23 HISTORY — DX: Unspecified osteoarthritis, unspecified site: M19.90

## 2016-12-23 HISTORY — DX: Personal history of urinary calculi: Z87.442

## 2016-12-23 HISTORY — PX: ESOPHAGOGASTRODUODENOSCOPY (EGD) WITH PROPOFOL: SHX5813

## 2016-12-23 SURGERY — ESOPHAGOGASTRODUODENOSCOPY (EGD) WITH PROPOFOL
Anesthesia: Monitor Anesthesia Care

## 2016-12-23 MED ORDER — LACTATED RINGERS IV SOLN
INTRAVENOUS | Status: DC
  Administered 2016-12-23: 1000 mL via INTRAVENOUS
  Administered 2016-12-23: 08:00:00 via INTRAVENOUS

## 2016-12-23 MED ORDER — SODIUM CHLORIDE 0.9 % IV SOLN: INTRAVENOUS | Status: DC

## 2016-12-23 MED ORDER — PROPOFOL 10 MG/ML IV BOLUS
INTRAVENOUS | Status: DC | PRN
  Administered 2016-12-23 (×17): 20 mg via INTRAVENOUS

## 2016-12-23 MED ORDER — PROPOFOL 10 MG/ML IV BOLUS
INTRAVENOUS | Status: AC
  Filled 2016-12-23: qty 40

## 2016-12-23 MED ORDER — LIDOCAINE 2% (20 MG/ML) 5 ML SYRINGE
INTRAMUSCULAR | Status: DC | PRN
  Administered 2016-12-23: 80 mg via INTRAVENOUS

## 2016-12-23 SURGICAL SUPPLY — 15 items

## 2016-12-23 NOTE — Anesthesia Procedure Notes (Signed)
Performed by: Cynda Familia Oxygen Delivery Method: Nasal cannula Placement Confirmation: breath sounds checked- equal and bilateral and positive ETCO2 Dental Injury: Teeth and Oropharynx as per pre-operative assessment  Comments: bite block by GI tech-- [place] for sedation

## 2016-12-23 NOTE — Anesthesia Preprocedure Evaluation (Addendum)
Anesthesia Evaluation  Patient identified by MRN, date of birth, ID band Patient awake    Reviewed: Allergy & Precautions, NPO status , Patient's Chart, lab work & pertinent test results  Airway Mallampati: II  TM Distance: >3 FB Neck ROM: Full    Dental no notable dental hx. (+) Dental Advisory Given   Pulmonary neg pulmonary ROS,    Pulmonary exam normal breath sounds clear to auscultation       Cardiovascular negative cardio ROS Normal cardiovascular exam Rhythm:Regular Rate:Normal     Neuro/Psych Anxiety negative neurological ROS     GI/Hepatic negative GI ROS, Neg liver ROS,   Endo/Other  Hypothyroidism Morbid obesity  Renal/GU negative Renal ROS  negative genitourinary   Musculoskeletal negative musculoskeletal ROS (+)   Abdominal   Peds negative pediatric ROS (+)  Hematology negative hematology ROS (+)   Anesthesia Other Findings   Reproductive/Obstetrics negative OB ROS                            Anesthesia Physical Anesthesia Plan  ASA: II  Anesthesia Plan: MAC   Post-op Pain Management:    Induction: Intravenous  PONV Risk Score and Plan: 0  Airway Management Planned: Simple Face Mask  Additional Equipment:   Intra-op Plan:   Post-operative Plan:   Informed Consent: I have reviewed the patients History and Physical, chart, labs and discussed the procedure including the risks, benefits and alternatives for the proposed anesthesia with the patient or authorized representative who has indicated his/her understanding and acceptance.   Dental advisory given  Plan Discussed with: CRNA and Surgeon  Anesthesia Plan Comments:         Anesthesia Quick Evaluation

## 2016-12-23 NOTE — H&P (Signed)
  Patient is a 46 year female multiple GI issues but was most bothered by post-prandial nausea, weight loss. She complained of dysphagia but only to pills. Started on Zantac and Zofran. UGI series obtained. Normal esophagus. Tablet passed into stomach.  Imaging of the stomach was limited by body habitus but gastric emptying was prompt. No pyloric, duodenum C-loop and ligament of Treitz abnormalites.   Seen in follow up in May by Megan Moreno, P.A. The Zofran caused constipation, she was changed to Phenergan.  She had gained 7 pounds, thought Zantac caused it so we changed her to Omeprazole. Upper abdominal pain better since starting omeprazole.  Most of the time the nausea is mild, only about once a week does it warrant an anti-emetic.  No further weight gain, she is stable at 326 pounds. For persistent dysphagia Megan Moreno, P.A. Sent patient for MBSS which was unremarkable.   Megan Moreno continues to have problems swallowing. Pills most problematic but sometimes vegetables problematic as well, especially celery. Corn and rice okay. Dr. Velora Heckler apparenty dilated her esophagus many years ago for same complaints and she felt better for many years.          Past Medical History:  Diagnosis Date  . Hypoglycemia   . Obesity   . Thyroid cancer ([place])   . Thyroid disease     Patient's surgical history, family medical history, social history, medications and allergies were all reviewed in Epic    Physical Exam: BP 120/86   Pulse 78   Ht 4' 11.5" (1.511 m)   Wt (!) 326 lb (147.9 kg)   BMI 64.74 kg/m   GENERAL: morbidly obese white female in NAD PSYCH:  cooperative, normal affect EENT:  conjunctiva pink, mucous membranes moist, neck supple without masses CARDIAC:  RRR, no murmur heard, no peripheral edema PULM: Normal respiratory effort, lungs CTA bilaterally, no wheezing ABDOMEN:  soft, obese, non-tender, normal bowel sounds SKIN:  turgor, no lesions seen NEURO: Alert and oriented x 3,  no focal neurologic deficits   ASSESSMENT and PLAN:  1. 47 yo female with chronic nausea. No vomiting or other symptoms suggestive of underlying organic disease. She is not diabetic. No narcotic use. No NSAID use. Idiopathic delayed gastric emptying vrs functional etiology. Symptoms have improved but not resolved with trial of PPI and anti-emetics. UGI series unremarkable.  Normal appearing gallbladder on ultrasound in April  -Will proceed with EGD to be done at hospital. The risks and benefits of EGD were discussed and the patient agrees to proceed.  -if EGD negative consider gastric emptying study.   2. Dysphagia, mainly to pills but occasionally solids. Esophogram and MBSS negative. Per patient she got relief after esophageal dilation by Dr. Velora Heckler many years ago. (can't locate report).  -consider empirical dilation at time of EGD.    3. Morbid obesity. BMI 64  4. Hypothyroidism, acquired. Remote thyroidectomy for cancer  5. Bipolar disorder, on Lithobid  6. Probable fatty liver by U/S.

## 2016-12-23 NOTE — Discharge Instructions (Signed)
YOU HAD AN ENDOSCOPIC PROCEDURE TODAY: Refer to the procedure report and other information in the discharge instructions given to you for any specific questions about what was found during the examination. If this information does not answer your questions, please call [place] office at [phone] to clarify.  ° °YOU SHOULD EXPECT: Some feelings of bloating in the abdomen. Passage of more gas than usual. Walking can help get rid of the air that was put into your GI tract during the procedure and reduce the bloating. If you had a lower endoscopy (such as a colonoscopy or flexible sigmoidoscopy) you may notice spotting of blood in your stool or on the toilet paper. Some abdominal soreness may be present for a day or two, also. ° °DIET: Your first meal following the procedure should be a light meal and then it is ok to progress to your normal diet. A half-sandwich or bowl of soup is an example of a good first meal. Heavy or fried foods are harder to digest and may make you feel nauseous or bloated. Drink plenty of fluids but you should avoid alcoholic beverages for 24 hours. If you had a esophageal dilation, please see attached instructions for diet.   ° °ACTIVITY: Your care partner should take you home directly after the procedure. You should plan to take it easy, moving slowly for the rest of the day. You can resume normal activity the day after the procedure however YOU SHOULD NOT DRIVE, use power tools, machinery or perform tasks that involve climbing or major physical exertion for 24 hours (because of the sedation medicines used during the test).  ° °SYMPTOMS TO REPORT IMMEDIATELY: °A gastroenterologist can be reached at any hour. Please call [phone]  for any of the following symptoms:  °Following lower endoscopy (colonoscopy, flexible sigmoidoscopy) °Excessive amounts of blood in the stool  °Significant tenderness, worsening of abdominal pains  °Swelling of the abdomen that is new, acute  °Fever of 100° or  higher  °Following upper endoscopy (EGD, EUS, ERCP, esophageal dilation) °Vomiting of blood or coffee ground material  °New, significant abdominal pain  °New, significant chest pain or pain under the shoulder blades  °Painful or persistently difficult swallowing  °New shortness of breath  °Black, tarry-looking or red, bloody stools ° °FOLLOW UP:  °If any biopsies were taken you will be contacted by phone or by letter within the next 1-3 weeks. Call [phone]  if you have not heard about the biopsies in 3 weeks.  °Please also call with any specific questions about appointments or follow up tests. ° °

## 2016-12-23 NOTE — Op Note (Signed)
[facility] Patient Name: Megan Moreno Procedure Date: 12/23/2016 MRN: [id number] Attending MD: Ladene Artist , MD Date of Birth: 09/21/1970 CSN: [id number] Age: 46 Admit Type: Outpatient Procedure:                Upper GI endoscopy Indications:              Dysphagia, Nausea, Weight loss Providers:                Pricilla Riffle. Hunsberger Plan, MD, Cleda Daub, RN, Alan Mulder, Technician Referring MD:             Jacelyn Pi Loanne Drilling, MD Medicines:                Monitored Anesthesia Care Complications:            No immediate complications. Estimated Blood Loss:     Estimated blood loss was minimal. Procedure:                Pre-Anesthesia Assessment:                           - Prior to the procedure, a History and Physical                            was performed, and patient medications and                            allergies were reviewed. The patient's tolerance of                            previous anesthesia was also reviewed. The risks                            and benefits of the procedure and the sedation                            options and risks were discussed with the patient.                            All questions were answered, and informed consent                            was obtained. Prior Anticoagulants: The patient has                            taken no previous anticoagulant or antiplatelet                            agents. ASA Grade Assessment: III - A patient with                            severe systemic disease. After reviewing the risks  and benefits, the patient was deemed in                            satisfactory condition to undergo the procedure.                           After obtaining informed consent, the endoscope was                            passed under direct vision. Throughout the                            procedure, the patient's blood pressure, pulse, and              oxygen saturations were monitored continuously. The                            Endoscope was introduced through the mouth, and                            advanced to the second part of duodenum. The upper                            GI endoscopy was accomplished without difficulty.                            The patient tolerated the procedure well. Scope In: Scope Out: Findings:      No endoscopic abnormality was evident in the esophagus to explain the       patient's complaint of dysphagia. It was decided, however, to proceed       with dilation of the entire esophagus. A guidewire was placed and the       scope was withdrawn. Dilation was performed with a Savary dilator with       no resistance at 16 mm.      Localized mildly erythematous mucosa without bleeding was found in the       gastric body. Biopsies were taken with a cold forceps for histology.      The exam of the stomach was otherwise normal.      The duodenal bulb and second portion of the duodenum were normal. Impression:               - No endoscopic esophageal abnormality to explain                            patient's dysphagia. Esophagus dilated. Dilated.                           - Erythematous mucosa in the gastric body. Biopsied.                           - Normal duodenal bulb and second portion of the                            duodenum. Moderate Sedation:      N/A- Per Anesthesia  Care Recommendation:           - Clear liquid diet for 2 hours, then advance as                            tolerated to soft diet today. Resume prior diet                            tomorrow.                           - Continue present medications.                           - Await pathology results. Procedure Code(s):        --- Professional ---                           [phone], Esophagogastroduodenoscopy, flexible,                            transoral; with insertion of guide wire followed by                             passage of dilator(s) through esophagus over guide                            wire                           [id number], Esophagogastroduodenoscopy, flexible,                            transoral; with biopsy, single or multiple Diagnosis Code(s):        --- Professional ---                           R13.10, Dysphagia, unspecified                           K31.89, Other diseases of stomach and duodenum                           R11.0, Nausea                           R63.4, Abnormal weight loss CPT copyright 2016 American Medical Association. All rights reserved. The codes documented in this report are preliminary and upon coder review may  be revised to meet current compliance requirements. Ladene Artist, MD 12/23/2016 9:00:10 AM This report has been signed electronically. Number of Addenda: 0

## 2016-12-23 NOTE — Transfer of Care (Signed)
Immediate Anesthesia Transfer of Care Note  Patient: Megan Moreno  Procedure(s) Performed: Procedure(s): ESOPHAGOGASTRODUODENOSCOPY (EGD) WITH PROPOFOL (N/A)  Patient Location: PACU and Endoscopy Unit  Anesthesia Type:MAC  Level of Consciousness: awake and alert   Airway & Oxygen Therapy: Patient Spontanous Breathing and Patient connected to nasal cannula oxygen  Post-op Assessment: Report given to RN and Post -op Vital signs reviewed and stable  Post vital signs: Reviewed and stable  Last Vitals:  Vitals:   12/23/16 0711  BP: (!) 170/85  Pulse: 79  Resp: 20  Temp: 36.8 C  SpO2: 98%    Last Pain:  Vitals:   12/23/16 0711  TempSrc: Oral         Complications: No apparent anesthesia complications

## 2016-12-23 NOTE — Anesthesia Postprocedure Evaluation (Signed)
Anesthesia Post Note  Patient: Megan Moreno  Procedure(s) Performed: Procedure(s) (LRB): ESOPHAGOGASTRODUODENOSCOPY (EGD) WITH PROPOFOL (N/A)     Patient location during evaluation: PACU Anesthesia Type: MAC Level of consciousness: awake and alert Pain management: pain level controlled Vital Signs Assessment: post-procedure vital signs reviewed and stable Respiratory status: spontaneous breathing, nonlabored ventilation, respiratory function stable and patient connected to nasal cannula oxygen Cardiovascular status: stable and blood pressure returned to baseline Anesthetic complications: no    Last Vitals:  Vitals:   12/23/16 0711 12/23/16 0904  BP: (!) 170/85 130/76  Pulse: 79 77  Resp: 20 20  Temp: 36.8 C 36.5 C  SpO2: 98% 100%    Last Pain:  Vitals:   12/23/16 0904  TempSrc: Oral                 Corleen Otwell S

## 2016-12-25 ENCOUNTER — Encounter (HOSPITAL_COMMUNITY): Payer: Self-pay | Admitting: Gastroenterology

## 2016-12-25 ENCOUNTER — Other Ambulatory Visit: Payer: Self-pay | Admitting: Nurse Practitioner

## 2016-12-31 ENCOUNTER — Encounter: Payer: Self-pay | Admitting: Gastroenterology

## 2017-01-01 DIAGNOSIS — L918 Other hypertrophic disorders of the skin: Secondary | ICD-10-CM | POA: Diagnosis not present

## 2017-01-14 DIAGNOSIS — N3281 Overactive bladder: Secondary | ICD-10-CM | POA: Diagnosis not present

## 2017-02-23 DIAGNOSIS — N390 Urinary tract infection, site not specified: Secondary | ICD-10-CM | POA: Diagnosis not present

## 2017-02-23 DIAGNOSIS — R3915 Urgency of urination: Secondary | ICD-10-CM | POA: Diagnosis not present

## 2017-03-16 ENCOUNTER — Emergency Department (HOSPITAL_COMMUNITY)
Admission: EM | Admit: 2017-03-16 | Discharge: 2017-03-16 | Disposition: A | Discharge status: Home / Self Care | Payer: Medicare Other | Attending: Emergency Medicine | Admitting: Emergency Medicine

## 2017-03-16 ENCOUNTER — Encounter (HOSPITAL_COMMUNITY): Payer: Self-pay | Admitting: Emergency Medicine

## 2017-03-16 DIAGNOSIS — Z79899 Other long term (current) drug therapy: Secondary | ICD-10-CM | POA: Insufficient documentation

## 2017-03-16 DIAGNOSIS — E039 Hypothyroidism, unspecified: Secondary | ICD-10-CM | POA: Insufficient documentation

## 2017-03-16 DIAGNOSIS — N952 Postmenopausal atrophic vaginitis: Secondary | ICD-10-CM | POA: Insufficient documentation

## 2017-03-16 DIAGNOSIS — R300 Dysuria: Secondary | ICD-10-CM | POA: Diagnosis present

## 2017-03-16 LAB — URINALYSIS, ROUTINE W REFLEX MICROSCOPIC
Bilirubin Urine: NEGATIVE
Glucose, UA: NEGATIVE mg/dL
Hgb urine dipstick: NEGATIVE
Ketones, ur: NEGATIVE mg/dL
Leukocytes, UA: NEGATIVE
Nitrite: NEGATIVE
Protein, ur: NEGATIVE mg/dL
Specific Gravity, Urine: 1.011 (ref 1.005–1.030)
pH: 7 (ref 5.0–8.0)

## 2017-03-16 LAB — WET PREP, GENITAL
Clue Cells Wet Prep HPF POC: NONE SEEN
Sperm: NONE SEEN
Trich, Wet Prep: NONE SEEN
Yeast Wet Prep HPF POC: NONE SEEN

## 2017-03-16 MED ORDER — NYSTATIN 100000 UNIT/GM EX POWD: Freq: Two times a day (BID) | CUTANEOUS | 0 refills | Status: DC

## 2017-03-16 MED ORDER — REPLENS VA GEL: 1.0000 "application " | VAGINAL | 0 refills | Status: AC

## 2017-03-16 NOTE — ED Notes (Signed)
ED Provider at bedside. 

## 2017-03-16 NOTE — ED Triage Notes (Signed)
Patient c/o pain with urination and white vaginal discharge. Hx yeast infection. Reports taking abx for UTI x2 weeks ago. Hx overactive bladder. Denies N/V/D.

## 2017-03-16 NOTE — ED Provider Notes (Signed)
[facility] DEPT Provider Note   CSN: [id number] Arrival date & time: 03/16/17  1235     History   Chief Complaint Chief Complaint  Patient presents with  . Dysuria  . Vaginal Discharge    HPI Megan Moreno is a 46 y.o. female.  HPI   46 year old female presenting for evaluation of dysuria.  Patient report approximately a month ago she was having dysuria.  She delay her care.  Due to taking care of her mom in the hospital who recently passed away.  She did follow-up with her primary care doctor on the 17th of last month and was given antibiotic that she took for 1 week.  Although her dysuria improved, she developed vaginal itching and malodorous discharge which she attributed to yeast infection.  For the past 2 weeks she now developed urinary discomfort including burning urination with urinary frequency and urgency.  States that she has history of overactive bladder which aggravates her symptoms.  She has tried to follow-up with her PCP but could not secure an appointment and decided to come here.  She endorses mild lower abdominal discomfort.  She denies any fever, chills, nausea vomiting diarrhea, hematuria, vaginal bleeding.  Denies any new sexual partners.  Symptoms are mild to moderate.  Past Medical History:  Diagnosis Date  . Anxiety   . Arthritis   . History of kidney stones   . Hypoglycemia   . Hypothyroidism   . Obesity   . Thyroid cancer ([place])   . Thyroid disease     Patient Active Problem List   Diagnosis Date Noted  . Loss of weight   . Nausea without vomiting   . Thyroid cancer ([place]) 09/05/2016  . Dysphagia 09/04/2016  . Nausea 09/04/2016  . Vulvar hyperplasia 09/22/2013    Past Surgical History:  Procedure Laterality Date  . ESOPHAGOGASTRODUODENOSCOPY (EGD) WITH PROPOFOL N/A 12/23/2016   Procedure: ESOPHAGOGASTRODUODENOSCOPY (EGD) WITH PROPOFOL;  Surgeon: Ladene Artist, MD;  Location: [facility] ENDOSCOPY;  Service: Endoscopy;   Laterality: N/A;  . HEEL SPUR SURGERY Left   . LITHOTRIPSY    . PARTIAL HYSTERECTOMY    . rotator cuff surgery Bilateral   . THYROIDECTOMY    . TONSILLECTOMY AND ADENOIDECTOMY     and adenoidectomy     OB History    Gravida Para Term Preterm AB Living   0 0 0 0 0 0   SAB TAB Ectopic Multiple Live Births   0 0 0 0         Home Medications    Prior to Admission medications   Medication Sig Start Date End Date Taking? Authorizing Provider  levothyroxine (SYNTHROID, LEVOTHROID) 112 MCG tablet Take 224 mcg by mouth daily before breakfast.   Yes [provider]  LITHOBID 300 MG CR tablet Take 600 mg by mouth at bedtime.  08/25/13  Yes [provider]  LORazepam (ATIVAN) 0.5 MG tablet Take 1 mg by mouth as needed for anxiety. For anxiety.    Yes [provider]  MYRBETRIQ 50 MG TB24 tablet Take 50 mg by mouth daily.  06/23/16  Yes [provider]  CALCIUM PO Take 1 tablet by mouth daily.    [provider]  Coenzyme Q10 (COQ-10) 100 MG CAPS Take 100 mg by mouth daily as needed (immune support).    [provider]  Flaxseed, Linseed, (FLAX SEEDS PO) Take 1,000 mg by mouth at bedtime.     [provider]  furosemide (  LASIX) 20 MG tablet Take 10 mg by mouth daily as needed for fluid.     [provider]  Misc Natural Products (TART CHERRY ADVANCED PO) Take 1 tablet by mouth daily.    [provider]  Multiple Vitamin (MULTIVITAMIN WITH MINERALS) TABS Take 1 tablet by mouth at bedtime.     [provider]  ondansetron (ZOFRAN) 4 MG tablet TAKE 1 TABLET BY MOUTH EVERY 8 HOURS AS NEEDED FOR NAUSEA AND VOMITING Patient not taking: Reported on 03/16/2017 12/25/16   Willia Craze, NP  OVER THE COUNTER MEDICATION Weight Control Apple cider Vinegar daily    [provider]  potassium chloride (MICRO-K) 10 MEQ CR capsule Take 10 mEq by mouth daily as needed (with lasix).  07/30/13   [provider]  PREVIDENT 5000 DRY MOUTH 1.1 % GEL dental gel Place 1 application onto teeth daily. 10/20/16   [provider]  promethazine (PHENERGAN) 12.5 MG tablet Take 1 tablet (12.5 mg total) by mouth every 8 (eight) hours as needed for nausea or vomiting. Patient not taking: Reported on 03/16/2017 09/04/16   Zehr, Laban Emperor, PA-C  VESICARE 10 MG tablet Take 10 mg by mouth at bedtime.  06/26/13   [provider]    Family History Family History  Problem Relation Age of Onset  . Ovarian cancer Mother   . Colon cancer Neg Hx   . Esophageal cancer Neg Hx   . Pancreatic cancer Neg Hx   . Stomach cancer Neg Hx   . Liver disease Neg Hx   . Thyroid cancer Neg Hx   . Thyroid disease Neg Hx     Social History Social History   Tobacco Use  . Smoking status: Never Smoker  . Smokeless tobacco: Never Used  Substance Use Topics  . Alcohol use: No  . Drug use: No     Allergies   Myrbetriq [mirabegron]; Morphine and related; and Omeprazole   Review of Systems Review of Systems  All other systems reviewed and are negative.    Physical Exam Updated Vital Signs BP (!) 150/86   Pulse 82   Temp 98.5 F (36.9 C) (Oral)   Resp 18   Ht 4\' 11"  (1.499 m)   Wt 127 kg (280 lb)   SpO2 95%   BMI 56.55 kg/m   Physical Exam  Constitutional: She appears well-developed and well-nourished. No distress.  Moderately obese female in no acute distress  HENT:  Head: Atraumatic.  Eyes: Conjunctivae are normal.  Neck: Neck supple.  Cardiovascular: Normal rate and regular rhythm.  Pulmonary/Chest: Effort normal and breath sounds normal.  Abdominal: Soft. She exhibits no distension. There is no tenderness.  Genitourinary:  Genitourinary Comments: Chaperone present during exam.  No inguinal lymph adenopathy or inguinal hernia noted.  Mild macerated skin changes to both labia minora and labia majora that is tender to palpation but no obvious discharge.  Discomfort with speculum insertion.   Limited visualization due to large body habitus, unable to visualize cervical loss.  No obvious discharge noted.  No adnexal tenderness or cervical motion tenderness however exam is limited due to body habitus.  Neurological: She is alert.  Skin: No rash noted.  Psychiatric: She has a normal mood and affect.  Nursing note and vitals reviewed.    ED Treatments / Results  Labs (all labs ordered are listed, but only abnormal results are displayed) Labs Reviewed  WET PREP, GENITAL - Abnormal; Notable for the following components:  Result Value   WBC, Wet Prep HPF POC FEW (*)    All other components within normal limits  URINALYSIS, ROUTINE W REFLEX MICROSCOPIC  GC/CHLAMYDIA PROBE AMP ([place]) NOT AT [facility]    EKG  EKG Interpretation None       Radiology No results found.  Procedures Procedures (including critical care time)  Medications Ordered in ED Medications - No data to display   Initial Impression / Assessment and Plan / ED Course  I have reviewed the triage vital signs and the nursing notes.  Pertinent labs & imaging results that were available during my care of the patient were reviewed by me and considered in my medical decision making (see chart for details).     BP (!) 143/88 (BP Location: Right Arm)   Pulse 93   Temp 98.5 F (36.9 C) (Oral)   Resp 16   Ht 4\' 11"  (1.499 m)   Wt 127 kg (280 lb)   SpO2 97%   BMI 56.55 kg/m    Final Clinical Impressions(s) / ED Diagnoses   Final diagnoses:  Atrophic vaginitis    ED Discharge Orders        Ordered    Vaginal Lubricant (REPLENS) GEL  3 times weekly     03/16/17 1726      3:24 PM Patient presents with dysuria, as well as symptoms suggestive of potential yeast infection.  Will obtain urinalysis and perform pelvic examination.   5:15 PM Patient does have some macerated skin changes along her labia minora and majora.  I suspect atrophic vaginitis is the reason why she is having discomfort.   Her urine shows no signs of urinary tract infection and no evidence of yeast infection or bacterial vaginosis.  She is not sexually active and has prior hysterectomy therefore we did not obtain HIV or syphilis screen although I did mention the option but patient declined.  No pregnancy test obtained due to hysterectomy.  Recommend using vaginal moisturizers and lubricants for comfort.  Patient should follow up with her PCP if her sxs persists, as she may benefit from hormonal cream if appropriate.  Return precaution given.     Domenic Moras, PA-C 03/16/17 1727    Duffy Bruce, MD 03/17/17 [phone]

## 2017-03-16 NOTE — Discharge Instructions (Signed)
Your vaginal discomfort is likely due to atrophic vaginitis.  Please try over the counter vaginal moisturizers and lubricants until you found one that you like best.  Use it two or three days per week.

## 2017-03-17 LAB — GC/CHLAMYDIA PROBE AMP (~~LOC~~) NOT AT ARMC
Chlamydia: NEGATIVE
Neisseria Gonorrhea: NEGATIVE

## 2017-04-09 DIAGNOSIS — N3281 Overactive bladder: Secondary | ICD-10-CM | POA: Diagnosis not present

## 2017-05-21 DIAGNOSIS — N3281 Overactive bladder: Secondary | ICD-10-CM | POA: Diagnosis not present

## 2017-07-02 DIAGNOSIS — N3281 Overactive bladder: Secondary | ICD-10-CM | POA: Diagnosis not present

## 2017-07-08 DIAGNOSIS — Z79899 Other long term (current) drug therapy: Secondary | ICD-10-CM | POA: Diagnosis not present

## 2017-07-15 DIAGNOSIS — I10 Essential (primary) hypertension: Secondary | ICD-10-CM | POA: Diagnosis not present

## 2017-07-15 DIAGNOSIS — E890 Postprocedural hypothyroidism: Secondary | ICD-10-CM | POA: Diagnosis not present

## 2017-07-15 DIAGNOSIS — E1165 Type 2 diabetes mellitus with hyperglycemia: Secondary | ICD-10-CM | POA: Diagnosis not present

## 2017-07-15 DIAGNOSIS — E782 Mixed hyperlipidemia: Secondary | ICD-10-CM | POA: Diagnosis not present

## 2017-07-16 ENCOUNTER — Other Ambulatory Visit: Payer: Self-pay

## 2017-07-16 ENCOUNTER — Emergency Department (HOSPITAL_COMMUNITY): Payer: Medicare Other

## 2017-07-16 ENCOUNTER — Encounter (HOSPITAL_COMMUNITY): Payer: Self-pay

## 2017-07-16 ENCOUNTER — Emergency Department (HOSPITAL_COMMUNITY)
Admission: EM | Admit: 2017-07-16 | Discharge: 2017-07-16 | Disposition: A | Discharge status: Home / Self Care | Payer: Medicare Other | Attending: Emergency Medicine | Admitting: Emergency Medicine

## 2017-07-16 DIAGNOSIS — M1711 Unilateral primary osteoarthritis, right knee: Secondary | ICD-10-CM | POA: Diagnosis not present

## 2017-07-16 DIAGNOSIS — Z79899 Other long term (current) drug therapy: Secondary | ICD-10-CM | POA: Diagnosis not present

## 2017-07-16 DIAGNOSIS — E039 Hypothyroidism, unspecified: Secondary | ICD-10-CM | POA: Insufficient documentation

## 2017-07-16 DIAGNOSIS — M25561 Pain in right knee: Secondary | ICD-10-CM | POA: Diagnosis not present

## 2017-07-16 NOTE — ED Triage Notes (Signed)
Pt reports falling on right knee in October and landing on stepping stone. PT reports since then she has had pain off and on in the knee but it seems more constant. PT ambulatory into triage

## 2017-07-16 NOTE — ED Provider Notes (Signed)
[facility] EMERGENCY DEPARTMENT Provider Note   CSN: [id number] Arrival date & time: 07/16/17  1523     History   Chief Complaint Chief Complaint  Patient presents with  . Leg Pain    HPI Megan Moreno is a 47 y.o. female with past medical history of arthritis, obesity, who presents to ED for evaluation of 62-month history of aching right knee pain worse with extension and weightbearing.  Symptoms began 7 months ago when she tripped and fell onto her right knee.  Since then she has had intermittent pain.  She took 1 dose of Tylenol with improvement in her symptoms yesterday.  She was not evaluated after the injury occurred.  She denies any numbness in her legs, prior fracture, dislocations or procedures in the area, history of gout, history of infected joint, fevers, other injuries.  HPI  Past Medical History:  Diagnosis Date  . Anxiety   . Arthritis   . History of kidney stones   . Hypoglycemia   . Hypothyroidism   . Obesity   . Thyroid cancer ([place])   . Thyroid disease     Patient Active Problem List   Diagnosis Date Noted  . Loss of weight   . Nausea without vomiting   . Thyroid cancer ([place]) 09/05/2016  . Dysphagia 09/04/2016  . Nausea 09/04/2016  . Vulvar hyperplasia 09/22/2013    Past Surgical History:  Procedure Laterality Date  . ESOPHAGOGASTRODUODENOSCOPY (EGD) WITH PROPOFOL N/A 12/23/2016   Procedure: ESOPHAGOGASTRODUODENOSCOPY (EGD) WITH PROPOFOL;  Surgeon: Ladene Artist, MD;  Location: [facility];  Service: Endoscopy;  Laterality: N/A;  . HEEL SPUR SURGERY Left   . LITHOTRIPSY    . PARTIAL HYSTERECTOMY    . rotator cuff surgery Bilateral   . THYROIDECTOMY    . TONSILLECTOMY AND ADENOIDECTOMY     and adenoidectomy      OB History    Gravida  0   Para  0   Term  0   Preterm  0   AB  0   Living  0     SAB  0   TAB  0   Ectopic  0   Multiple  0   Live Births               Home Medications    Prior to  Admission medications   Medication Sig Start Date End Date Taking? Authorizing Provider  levothyroxine (SYNTHROID, LEVOTHROID) 112 MCG tablet Take 224 mcg by mouth daily before breakfast.    [provider]  LITHOBID 300 MG CR tablet Take 600 mg by mouth at bedtime.  08/25/13   [provider]  Loperamide HCl (ANTI-DIARRHEAL PO) Take 2 tablets by mouth daily as needed (DIARRHEA).    [provider]  LORazepam (ATIVAN) 0.5 MG tablet Take 1 mg by mouth as needed for anxiety. For anxiety.     [provider]  Multiple Vitamin (DAILY VITAMIN PO) Take 1 tablet by mouth daily.    [provider]  nystatin (NYSTATIN) powder Apply topically 2 (two) times daily. Apply to skin folds to help prevent yeast infections 03/16/17   Duffy Bruce, MD  ondansetron (ZOFRAN) 4 MG tablet TAKE 1 TABLET BY MOUTH EVERY 8 HOURS AS NEEDED FOR NAUSEA AND VOMITING Patient not taking: Reported on 03/16/2017 12/25/16   Willia Craze, NP  potassium chloride (K-DUR) 10 MEQ tablet Take 10 mEq by mouth daily.    [provider]  promethazine (  PHENERGAN) 12.5 MG tablet Take 1 tablet (12.5 mg total) by mouth every 8 (eight) hours as needed for nausea or vomiting. Patient not taking: Reported on 03/16/2017 09/04/16   Zehr, Laban Emperor, PA-C    Family History Family History  Problem Relation Age of Onset  . Ovarian cancer Mother   . Colon cancer Neg Hx   . Esophageal cancer Neg Hx   . Pancreatic cancer Neg Hx   . Stomach cancer Neg Hx   . Liver disease Neg Hx   . Thyroid cancer Neg Hx   . Thyroid disease Neg Hx     Social History Social History   Tobacco Use  . Smoking status: Never Smoker  . Smokeless tobacco: Never Used  Substance Use Topics  . Alcohol use: No  . Drug use: No     Allergies   Myrbetriq [mirabegron]; Morphine and related; and Omeprazole   Review of Systems Review of Systems  Constitutional: Negative for chills and fever.    Gastrointestinal: Negative for nausea and vomiting.  Musculoskeletal: Positive for arthralgias. Negative for back pain, gait problem, joint swelling, myalgias, neck pain and neck stiffness.  Skin: Negative for wound.  Neurological: Negative for weakness and numbness.     Physical Exam Updated Vital Signs BP (!) 157/89 (BP Location: Right Arm)   Temp 98.3 F (36.8 C) (Oral)   Resp 20   SpO2 97%   Physical Exam  Constitutional: She appears well-developed and well-nourished. No distress.  HENT:  Head: Normocephalic and atraumatic.  Eyes: Conjunctivae and EOM are normal. No scleral icterus.  Neck: Normal range of motion.  Pulmonary/Chest: Effort normal. No respiratory distress.  Musculoskeletal: Normal range of motion. She exhibits tenderness. She exhibits no edema or deformity.  Tenderness to palpation of the right patella.  No visible deformity noted.  No edema, erythema or calf tenderness noted.  Able to perform full active and passive range of motion of right knee without difficulty.  Normal sensation noted.  2+ DP pulse noted bilaterally. Ambulatory with normal gait.  Neurological: She is alert.  Skin: No rash noted. She is not diaphoretic.  Psychiatric: She has a normal mood and affect.  Nursing note and vitals reviewed.    ED Treatments / Results  Labs (all labs ordered are listed, but only abnormal results are displayed) Labs Reviewed - No data to display  EKG None  Radiology Dg Knee Complete 4 Views Right  Result Date: 07/16/2017 CLINICAL DATA:  Chronic right knee pain. EXAM: RIGHT KNEE - COMPLETE 4+ VIEW COMPARISON:  None. FINDINGS: No acute fracture or dislocation. Moderate medial and patellofemoral compartment joint space narrowing. Tricompartmental osteophytes. No significant joint effusion. Osteopenia. Soft tissues are unremarkable. IMPRESSION: 1. Moderate medial and patellofemoral compartment osteoarthritis. 2.  No acute osseous abnormality. Electronically  Signed   By: Titus Dubin M.D.   On: 07/16/2017 16:29    Procedures Procedures (including critical care time)  Medications Ordered in ED Medications - No data to display   Initial Impression / Assessment and Plan / ED Course  I have reviewed the triage vital signs and the nursing notes.  Pertinent labs & imaging results that were available during my care of the patient were reviewed by me and considered in my medical decision making (see chart for details).     Patient presents to ED for evaluation of right knee pain for the past 7 months.  Describes the pain as aching.  Symptoms began after she tripped and fell onto her  right knee 7 months ago.  No prior fracture, dislocations or procedures in the area.  On physical exam she is able to perform full active and passive range of motion of knee without difficulty.  Area is CMS intact.  No signs of vascular or infectious cause of symptoms and x-ray shows osteoarthritis with no acute osseous abnormality, which is most likely the cause of her symptoms.  Will give patient Ace wrap, advised rice therapy and advised her to follow-up with orthopedist for further evaluation.  Advised to alternate Tylenol and ibuprofen to help with pain control.  Patient appears stable for discharge at this time.  Strict return precautions given.  Final Clinical Impressions(s) / ED Diagnoses   Final diagnoses:  Osteoarthritis of right knee, unspecified osteoarthritis type    ED Discharge Orders    None       Delia Heady, PA-C 07/16/17 1759    Gareth Morgan, MD 07/17/17 1354

## 2017-07-17 ENCOUNTER — Other Ambulatory Visit: Payer: Self-pay

## 2017-07-27 DIAGNOSIS — C73 Malignant neoplasm of thyroid gland: Secondary | ICD-10-CM | POA: Diagnosis not present

## 2017-07-27 DIAGNOSIS — I10 Essential (primary) hypertension: Secondary | ICD-10-CM | POA: Diagnosis not present

## 2017-07-27 DIAGNOSIS — E890 Postprocedural hypothyroidism: Secondary | ICD-10-CM | POA: Diagnosis not present

## 2017-07-27 DIAGNOSIS — E1165 Type 2 diabetes mellitus with hyperglycemia: Secondary | ICD-10-CM | POA: Diagnosis not present

## 2017-07-29 DIAGNOSIS — M25561 Pain in right knee: Secondary | ICD-10-CM | POA: Diagnosis not present

## 2017-07-31 DIAGNOSIS — I10 Essential (primary) hypertension: Secondary | ICD-10-CM | POA: Diagnosis not present

## 2017-07-31 DIAGNOSIS — E782 Mixed hyperlipidemia: Secondary | ICD-10-CM | POA: Diagnosis not present

## 2017-07-31 DIAGNOSIS — N3281 Overactive bladder: Secondary | ICD-10-CM | POA: Diagnosis not present

## 2017-07-31 DIAGNOSIS — C73 Malignant neoplasm of thyroid gland: Secondary | ICD-10-CM | POA: Diagnosis not present

## 2017-08-20 DIAGNOSIS — R310 Gross hematuria: Secondary | ICD-10-CM | POA: Diagnosis not present

## 2017-08-27 DIAGNOSIS — N200 Calculus of kidney: Secondary | ICD-10-CM | POA: Diagnosis not present

## 2017-08-27 DIAGNOSIS — R310 Gross hematuria: Secondary | ICD-10-CM | POA: Diagnosis not present

## 2017-09-03 DIAGNOSIS — R310 Gross hematuria: Secondary | ICD-10-CM | POA: Diagnosis not present

## 2017-09-03 DIAGNOSIS — N200 Calculus of kidney: Secondary | ICD-10-CM | POA: Diagnosis not present

## 2017-09-07 ENCOUNTER — Ambulatory Visit: Payer: Medicare Other | Admitting: Endocrinology

## 2017-09-07 DIAGNOSIS — Z0289 Encounter for other administrative examinations: Secondary | ICD-10-CM

## 2017-09-28 DIAGNOSIS — E782 Mixed hyperlipidemia: Secondary | ICD-10-CM | POA: Diagnosis not present

## 2017-09-28 DIAGNOSIS — C73 Malignant neoplasm of thyroid gland: Secondary | ICD-10-CM | POA: Diagnosis not present

## 2017-09-28 DIAGNOSIS — E890 Postprocedural hypothyroidism: Secondary | ICD-10-CM | POA: Diagnosis not present

## 2017-09-30 DIAGNOSIS — R300 Dysuria: Secondary | ICD-10-CM | POA: Diagnosis not present

## 2017-09-30 DIAGNOSIS — M545 Low back pain: Secondary | ICD-10-CM | POA: Diagnosis not present

## 2017-09-30 DIAGNOSIS — I10 Essential (primary) hypertension: Secondary | ICD-10-CM | POA: Diagnosis not present

## 2017-09-30 DIAGNOSIS — N390 Urinary tract infection, site not specified: Secondary | ICD-10-CM | POA: Diagnosis not present

## 2017-09-30 DIAGNOSIS — M549 Dorsalgia, unspecified: Secondary | ICD-10-CM | POA: Diagnosis not present

## 2017-09-30 DIAGNOSIS — R109 Unspecified abdominal pain: Secondary | ICD-10-CM | POA: Diagnosis not present

## 2017-10-01 ENCOUNTER — Other Ambulatory Visit: Payer: Self-pay | Admitting: Internal Medicine

## 2017-10-01 DIAGNOSIS — R109 Unspecified abdominal pain: Secondary | ICD-10-CM

## 2017-11-18 DIAGNOSIS — E782 Mixed hyperlipidemia: Secondary | ICD-10-CM | POA: Diagnosis not present

## 2017-11-18 DIAGNOSIS — E1165 Type 2 diabetes mellitus with hyperglycemia: Secondary | ICD-10-CM | POA: Diagnosis not present

## 2017-11-18 DIAGNOSIS — I10 Essential (primary) hypertension: Secondary | ICD-10-CM | POA: Diagnosis not present

## 2017-11-26 DIAGNOSIS — Z0189 Encounter for other specified special examinations: Secondary | ICD-10-CM | POA: Diagnosis not present

## 2017-11-26 DIAGNOSIS — R55 Syncope and collapse: Secondary | ICD-10-CM | POA: Diagnosis not present

## 2017-12-25 DIAGNOSIS — R55 Syncope and collapse: Secondary | ICD-10-CM | POA: Diagnosis not present

## 2018-01-06 DIAGNOSIS — R55 Syncope and collapse: Secondary | ICD-10-CM | POA: Diagnosis not present

## 2018-01-14 DIAGNOSIS — R55 Syncope and collapse: Secondary | ICD-10-CM | POA: Diagnosis not present

## 2018-01-14 DIAGNOSIS — R600 Localized edema: Secondary | ICD-10-CM | POA: Diagnosis not present

## 2018-01-14 DIAGNOSIS — Z0189 Encounter for other specified special examinations: Secondary | ICD-10-CM | POA: Diagnosis not present

## 2018-01-14 DIAGNOSIS — R002 Palpitations: Secondary | ICD-10-CM | POA: Diagnosis not present

## 2018-01-18 DIAGNOSIS — R55 Syncope and collapse: Secondary | ICD-10-CM | POA: Diagnosis not present

## 2018-01-29 DIAGNOSIS — R55 Syncope and collapse: Secondary | ICD-10-CM | POA: Diagnosis not present

## 2018-02-06 ENCOUNTER — Other Ambulatory Visit: Payer: Self-pay

## 2018-02-06 ENCOUNTER — Emergency Department (HOSPITAL_COMMUNITY)
Admission: EM | Admit: 2018-02-06 | Discharge: 2018-02-07 | Disposition: A | Discharge status: Home / Self Care | Payer: Medicare Other | Attending: Emergency Medicine | Admitting: Emergency Medicine

## 2018-02-06 ENCOUNTER — Encounter (HOSPITAL_COMMUNITY): Payer: Self-pay

## 2018-02-06 DIAGNOSIS — R079 Chest pain, unspecified: Secondary | ICD-10-CM | POA: Diagnosis not present

## 2018-02-06 DIAGNOSIS — C73 Malignant neoplasm of thyroid gland: Secondary | ICD-10-CM | POA: Insufficient documentation

## 2018-02-06 DIAGNOSIS — E039 Hypothyroidism, unspecified: Secondary | ICD-10-CM | POA: Insufficient documentation

## 2018-02-06 DIAGNOSIS — Z79899 Other long term (current) drug therapy: Secondary | ICD-10-CM | POA: Diagnosis not present

## 2018-02-06 DIAGNOSIS — R0789 Other chest pain: Secondary | ICD-10-CM | POA: Diagnosis not present

## 2018-02-06 NOTE — ED Triage Notes (Signed)
Pt here for evaluation of pressure on her chest. States whenever she begins to do anything strenuous her chest hurts and she feels dizzy.  No nausea or vomiting.  HTN noted in triage pt states no hx of such. A&Ox4.

## 2018-02-07 ENCOUNTER — Emergency Department (HOSPITAL_COMMUNITY): Payer: Medicare Other

## 2018-02-07 ENCOUNTER — Encounter (HOSPITAL_COMMUNITY): Payer: Self-pay | Admitting: Student

## 2018-02-07 DIAGNOSIS — R079 Chest pain, unspecified: Secondary | ICD-10-CM | POA: Diagnosis not present

## 2018-02-07 DIAGNOSIS — R0789 Other chest pain: Secondary | ICD-10-CM | POA: Diagnosis not present

## 2018-02-07 LAB — BASIC METABOLIC PANEL
Anion gap: 8 (ref 5–15)
BUN: 17 mg/dL (ref 6–20)
CO2: 24 mmol/L (ref 22–32)
Calcium: 9.4 mg/dL (ref 8.9–10.3)
Chloride: 107 mmol/L (ref 98–111)
Creatinine, Ser: 0.84 mg/dL (ref 0.44–1.00)
GFR calc Af Amer: >60 mL/min (ref >60)
GFR calc non Af Amer: >60 mL/min (ref >60)
Glucose, Bld: 147 mg/dL — ABNORMAL HIGH (ref 70–99)
Potassium: 3.8 mmol/L (ref 3.5–5.1)
Sodium: 139 mmol/L (ref 135–145)

## 2018-02-07 LAB — I-STAT TROPONIN, ED
Troponin i, poc: 0 ng/mL (ref 0.00–0.08)
Troponin i, poc: 0 ng/mL (ref 0.00–0.08)

## 2018-02-07 LAB — CBC
HCT: 48.8 % — ABNORMAL HIGH (ref 36.0–46.0)
Hemoglobin: 15.7 g/dL — ABNORMAL HIGH (ref 12.0–15.0)
MCH: 31.3 pg (ref 26.0–34.0)
MCHC: 32.2 g/dL (ref 30.0–36.0)
MCV: 97.2 fL (ref 80.0–100.0)
Platelets: 262 10*3/uL (ref 150–400)
RBC: 5.02 MIL/uL (ref 3.87–5.11)
RDW: 12.7 % (ref 11.5–15.5)
WBC: 9.7 10*3/uL (ref 4.0–10.5)
nRBC: 0 % (ref 0.0–0.2)

## 2018-02-07 LAB — D-DIMER, QUANTITATIVE: D-Dimer, Quant: 0.76 ug/mL-FEU — ABNORMAL HIGH (ref 0.00–0.50)

## 2018-02-07 MED ORDER — GI COCKTAIL ~~LOC~~
30.0000 mL | Freq: Once | ORAL | Status: AC
  Administered 2018-02-07: 30 mL via ORAL
  Filled 2018-02-07: qty 30

## 2018-02-07 MED ORDER — FAMOTIDINE IN NACL 20-0.9 MG/50ML-% IV SOLN
20.0000 mg | Freq: Once | INTRAVENOUS | Status: AC
  Administered 2018-02-07: 20 mg via INTRAVENOUS
  Filled 2018-02-07: qty 50

## 2018-02-07 MED ORDER — IOPAMIDOL (ISOVUE-370) INJECTION 76%
INTRAVENOUS | Status: AC
  Filled 2018-02-07: qty 100

## 2018-02-07 MED ORDER — IOPAMIDOL (ISOVUE-370) INJECTION 76%
100.0000 mL | Freq: Once | INTRAVENOUS | Status: AC | PRN
  Administered 2018-02-07: 100 mL via INTRAVENOUS

## 2018-02-07 NOTE — ED Notes (Signed)
ED Provider at bedside. 

## 2018-02-07 NOTE — ED Provider Notes (Signed)
Received signout at the beginning of shift.  Patient here with chest pain.  No significant complaint of shortness of breath however a d-dimer was obtained and it was mildly elevated.  A chest CT angiogram was subsequently obtained and the result shows suboptimal opacification of the pulmonary arterial system.  No obvious emboli seen in the main or proximal lobar arteries however exam is non-diagnosis for more distal pulmonary arteries.  This finding was discussed with patient with encouragement to return if her symptoms worsen.  Otherwise, she is to follow-up with her cardiology in the next few days as previously scheduled.  BP 126/86   Pulse 83   Temp 98.5 F (36.9 C) (Oral)   Resp 16   SpO2 92%   Results for orders placed or performed during the hospital encounter of 50/53/97  Basic metabolic panel  Result Value Ref Range   Sodium 139 135 - 145 mmol/L   Potassium 3.8 3.5 - 5.1 mmol/L   Chloride 107 98 - 111 mmol/L   CO2 24 22 - 32 mmol/L   Glucose, Bld 147 (H) 70 - 99 mg/dL   BUN 17 6 - 20 mg/dL   Creatinine, Ser 0.84 0.44 - 1.00 mg/dL   Calcium 9.4 8.9 - 10.3 mg/dL   GFR calc non Af Amer >60 >60 mL/min   GFR calc Af Amer >60 >60 mL/min   Anion gap 8 5 - 15  CBC  Result Value Ref Range   WBC 9.7 4.0 - 10.5 K/uL   RBC 5.02 3.87 - 5.11 MIL/uL   Hemoglobin 15.7 (H) 12.0 - 15.0 g/dL   HCT 48.8 (H) 36.0 - 46.0 %   MCV 97.2 80.0 - 100.0 fL   MCH 31.3 26.0 - 34.0 pg   MCHC 32.2 30.0 - 36.0 g/dL   RDW 12.7 11.5 - 15.5 %   Platelets 262 150 - 400 K/uL   nRBC 0.0 0.0 - 0.2 %  D-dimer, quantitative (not at [facility])  Result Value Ref Range   D-Dimer, Quant 0.76 (H) 0.00 - 0.50 ug/mL-FEU  I-stat troponin, ED  Result Value Ref Range   Troponin i, poc 0.00 0.00 - 0.08 ng/mL   Comment 3          I-stat troponin, ED  Result Value Ref Range   Troponin i, poc 0.00 0.00 - 0.08 ng/mL   Comment 3           Dg Chest 2 View  Result Date: 02/07/2018 CLINICAL DATA:  Chest pressure EXAM:  CHEST - 2 VIEW COMPARISON:  08/08/2009 FINDINGS: The heart size and mediastinal contours are within normal limits. Both lungs are clear. Chronic degenerative change along the thoracic spine. IMPRESSION: No active cardiopulmonary disease. Thoracic spondylosis without acute osseous abnormality. Electronically Signed   By: Ashley Royalty M.D.   On: 02/07/2018 00:33   Ct Angio Chest Pe W/cm &/or Wo Cm  Result Date: 02/07/2018 CLINICAL DATA:  Chest pressure with dizziness. EXAM: CT ANGIOGRAPHY CHEST WITH CONTRAST TECHNIQUE: Multidetector CT imaging of the chest was performed using the standard protocol during bolus administration of intravenous contrast. Multiplanar CT image reconstructions and MIPs were obtained to evaluate the vascular anatomy. CONTRAST:  100 mL ISOVUE-370 IOPAMIDOL (ISOVUE-370) INJECTION 76% COMPARISON:  04/28/2007 FINDINGS: Cardiovascular: Heart is normal size. Subtle calcified plaque over the left anterior descending coronary artery. Thoracic aorta is within normal. Pulmonary arterial opacification is suboptimal as scan was repeated with bolus tracking as second set of images still suboptimal. There are no  emboli seen within the main pulmonary arteries or proximal lobar arteries as it would be impossible exclude more distal pulmonary emboli on this exam. Mediastinum/Nodes: No evidence of mediastinal or hilar adenopathy. There are a few small subcentimeter lymph nodes adjacent the mid to distal esophagus. Lungs/Pleura: Lungs are adequately inflated without consolidation or effusion. Airways are normal. Upper Abdomen: No acute findings. Musculoskeletal: Degenerative change of the spine. Review of the MIP images confirms the above findings. IMPRESSION: Suboptimal opacification of the pulmonary arterial system despite repeat imaging as there are no emboli seen over the main or proximal lobar arteries. Exam is nondiagnostic for more distal pulmonary arteries. No acute cardiopulmonary disease. Subtle  atherosclerotic coronary artery disease. Electronically Signed   By: Marin Olp M.D.   On: 02/07/2018 08:30      Domenic Moras, PA-C 02/07/18 4360    Quintella Reichert, MD 02/07/18 [phone]

## 2018-02-07 NOTE — Discharge Instructions (Signed)
You were seen in the emergency department today for chest pain. Your work-up in the emergency department has been overall reassuring. Your labs have been fairly normal and or similar to previous blood work you have had done. Your EKG and the enzyme we use to check your heart did not show an acute heart attack at this time. Your chest x-ray was normal. Your CT scan did not show signs of a blood clot.   We would like you to follow up closely with your cardiologist as scheduled tomorrow. Please also follow up with your primary care provider within 1 week. Return to the ER immediately should you experience any new or worsening symptoms including but not limited to return of pain, worsened pain, vomiting, shortness of breath, dizziness, lightheadedness, passing out, or any other concerns that you may have.

## 2018-02-07 NOTE — ED Notes (Addendum)
Pt discharged from ED; instructions provided; Pt encouraged to return to ED if symptoms worsen and to f/u with PCP; Pt verbalized understanding of all instructions 

## 2018-02-07 NOTE — ED Provider Notes (Signed)
[facility] EMERGENCY DEPARTMENT Provider Note   CSN: [id number] Arrival date & time: 02/06/18  2348     History   Chief Complaint Chief Complaint  Patient presents with  . Chest Pain    HPI REATHA SUR is a 47 y.o. female with a history of anxiety, nephrolithiasis, hypothyroidism, and thyroid cancer s/p thyroidectomy who presents to the ED with complaints of chest discomfort the for the past 2 to 3 days.  Patient states that pain is located to the left chest describes this as a heaviness/pressure.  She states that initially the pain was coming and going with it it has been constant.  Her current discomfort is an 10 out of 10 in severity.  States it is worse when she reaches to the ground for something, not with exertion.  No other specific alleviating or aggravating factors.  She does note that she does get short of breath when she is active over past few days.  Also has noted some diaphoresis when pain gets really bad.  She states that she has had an issue with lightheadedness from transitioning to sitting to standing and with activity for several months now, this is not necessarily new or worsened.  Reports she has been seen by cardiology, she does not know the name of her cardiologist, she states that she had a stress test 1.5 weeks ago but has not received the results, she states she has an appointment to see her cardiologist on Monday to discuss these.  Denies fever, chills, nausea, vomiting, or syncope. Denies leg pain/swelling, hemoptysis, recent surgery/trauma, recent long travel, hormone use, or hx of DVT/PE.  She does report a history of thyroid cancer, she is status post thyroidectomy in 2005 and has been in remission since without active treatment.   HPI  Past Medical History:  Diagnosis Date  . Anxiety   . Arthritis   . History of kidney stones   . Hypoglycemia   . Hypothyroidism   . Obesity   . Thyroid cancer ([place])   . Thyroid disease     Patient  Active Problem List   Diagnosis Date Noted  . Loss of weight   . Nausea without vomiting   . Thyroid cancer ([place]) 09/05/2016  . Dysphagia 09/04/2016  . Nausea 09/04/2016  . Vulvar hyperplasia 09/22/2013    Past Surgical History:  Procedure Laterality Date  . ESOPHAGOGASTRODUODENOSCOPY (EGD) WITH PROPOFOL N/A 12/23/2016   Procedure: ESOPHAGOGASTRODUODENOSCOPY (EGD) WITH PROPOFOL;  Surgeon: Ladene Artist, MD;  Location: [facility];  Service: Endoscopy;  Laterality: N/A;  . HEEL SPUR SURGERY Left   . LITHOTRIPSY    . PARTIAL HYSTERECTOMY    . rotator cuff surgery Bilateral   . THYROIDECTOMY    . TONSILLECTOMY AND ADENOIDECTOMY     and adenoidectomy      OB History    Gravida  0   Para  0   Term  0   Preterm  0   AB  0   Living  0     SAB  0   TAB  0   Ectopic  0   Multiple  0   Live Births               Home Medications    Prior to Admission medications   Medication Sig Start Date End Date Taking? Authorizing Provider  levothyroxine (SYNTHROID, LEVOTHROID) 112 MCG tablet Take 224 mcg by mouth daily before breakfast.    [provider]  LITHOBID 300 MG CR tablet Take 600 mg by mouth at bedtime.  08/25/13   [provider]  Loperamide HCl (ANTI-DIARRHEAL PO) Take 2 tablets by mouth daily as needed (DIARRHEA).    [provider]  LORazepam (ATIVAN) 0.5 MG tablet Take 1 mg by mouth as needed for anxiety. For anxiety.     [provider]  Multiple Vitamin (DAILY VITAMIN PO) Take 1 tablet by mouth daily.    [provider]  nystatin (NYSTATIN) powder Apply topically 2 (two) times daily. Apply to skin folds to help prevent yeast infections 03/16/17   Duffy Bruce, MD  ondansetron (ZOFRAN) 4 MG tablet TAKE 1 TABLET BY MOUTH EVERY 8 HOURS AS NEEDED FOR NAUSEA AND VOMITING Patient not taking: Reported on 03/16/2017 12/25/16   Willia Craze, NP  potassium chloride (K-DUR) 10 MEQ tablet Take 10 mEq by mouth daily.     [provider]  promethazine (PHENERGAN) 12.5 MG tablet Take 1 tablet (12.5 mg total) by mouth every 8 (eight) hours as needed for nausea or vomiting. Patient not taking: Reported on 03/16/2017 09/04/16   Zehr, Laban Emperor, PA-C    Family History Family History  Problem Relation Age of Onset  . Ovarian cancer Mother   . Colon cancer Neg Hx   . Esophageal cancer Neg Hx   . Pancreatic cancer Neg Hx   . Stomach cancer Neg Hx   . Liver disease Neg Hx   . Thyroid cancer Neg Hx   . Thyroid disease Neg Hx     Social History Social History   Tobacco Use  . Smoking status: Never Smoker  . Smokeless tobacco: Never Used  Substance Use Topics  . Alcohol use: No  . Drug use: No     Allergies   Myrbetriq [mirabegron]; Morphine and related; and Omeprazole   Review of Systems Review of Systems  Constitutional: Positive for diaphoresis. Negative for chills and fever.  Respiratory: Positive for shortness of breath. Negative for cough.   Cardiovascular: Positive for chest pain. Negative for palpitations and leg swelling.  Gastrointestinal: Negative for abdominal pain, nausea and vomiting.  Skin: Negative for wound.  Neurological: Positive for light-headedness. Negative for dizziness, syncope, weakness, numbness and headaches.  All other systems reviewed and are negative.    Physical Exam Updated Vital Signs BP (!) 170/86 (BP Location: Right Arm)   Pulse 91   Temp 98.5 F (36.9 C) (Oral)   Resp (!) 22   SpO2 98%   Physical Exam  Constitutional: She appears well-developed and well-nourished.  Non-toxic appearance. No distress.  Obese female resting comfortably.   HENT:  Head: Normocephalic and atraumatic.  Eyes: Conjunctivae are normal. Right eye exhibits no discharge. Left eye exhibits no discharge.  Neck: Neck supple.  Cardiovascular: Normal rate and regular rhythm.  No murmur heard. Pulses:      Radial pulses are 2+ on the right side, and 2+ on the left side.    Pulmonary/Chest: Effort normal and breath sounds normal. No respiratory distress. She has no wheezes. She has no rhonchi. She has no rales.  Respiration even and unlabored  Abdominal: Soft. She exhibits no distension. There is no tenderness.  Neurological: She is alert.  Clear speech.  CN III through XII grossly intact.  Symmetric 5 out of 5 grip strength and 5 out of 5 strength plantar dorsiflexion bilaterally.  Sensation grossly intact bilateral upper and lower extremities.  Skin: Skin is warm and dry. No rash noted.  Psychiatric:  She has a normal mood and affect. Her behavior is normal.  Nursing note and vitals reviewed.   ED Treatments / Results  Labs (all labs ordered are listed, but only abnormal results are displayed) Labs Reviewed  BASIC METABOLIC PANEL - Abnormal; Notable for the following components:      Result Value   Glucose, Bld 147 (*)    All other components within normal limits  CBC - Abnormal; Notable for the following components:   Hemoglobin 15.7 (*)    HCT 48.8 (*)    All other components within normal limits  D-DIMER, QUANTITATIVE (NOT AT [facility]) - Abnormal; Notable for the following components:   D-Dimer, Quant 0.76 (*)    All other components within normal limits  I-STAT TROPONIN, ED  I-STAT TROPONIN, ED    EKG EKG Interpretation  Date/Time:  Saturday February 06 2018 23:54:42 EDT Ventricular Rate:  95 PR Interval:  144 QRS Duration: 68 QT Interval:  368 QTC Calculation: 462 R Axis:   25 Text Interpretation:  Normal sinus rhythm Normal ECG No significant change since last tracing Confirmed by Addison Lank [phone]) on 02/07/2018 1:14:14 AM   Radiology Dg Chest 2 View  Result Date: 02/07/2018 CLINICAL DATA:  Chest pressure EXAM: CHEST - 2 VIEW COMPARISON:  08/08/2009 FINDINGS: The heart size and mediastinal contours are within normal limits. Both lungs are clear. Chronic degenerative change along the thoracic spine. IMPRESSION: No active  cardiopulmonary disease. Thoracic spondylosis without acute osseous abnormality. Electronically Signed   By: Ashley Royalty M.D.   On: 02/07/2018 00:33    Procedures Procedures (including critical care time)  Medications Ordered in ED Medications - No data to display   Initial Impression / Assessment and Plan / ED Course  I have reviewed the triage vital signs and the nursing notes.  Pertinent labs & imaging results that were available during my care of the patient were reviewed by me and considered in my medical decision making (see chart for details).    Patient presents to the emergency department with chest pain. Patient nontoxic appearing, in no apparent distress, vitals notable for elevated BP improving throughout ED visit, low suspicion for HTN emergency. Fairly benign physical exam. DDX: ACS, pulmonary embolism, dissection, pneumothorax, effusion, infiltrate, arrhythmia, anemia, electrolyte derangement, MSK, GERD/PUD.   Labs reviewed, no leukocytosis, anemia, or significant electrolyte abnormality. CXR without infiltrate, effusion, pneumothorax, or fracture/dislocation. Low risk heart score, EKG without obvious ischemia or significant change from prior, delta troponin negative, doubt ACS, patient has had recent stress test and is planning to follow up with cardiology tomorrow. Pain is not a tearing sensation, symmetric pulses, no widening of mediastinum on CXR, doubt dissection. D-dimer was obtained and subsequently positive- CTA ordered. Of note patient did have some mild improvement following GI cocktail, pepcid has also been ordered.   07:00: Patient signed out to Domenic Moras PA-C at change of shift pending CTA. If imaging negative feel patient can be discharged home with close cardiology follow up as scheduled.   Findings and plan of care discussed with supervising physician Dr. Leonette Monarch who is in agreement.   Final Clinical Impressions(s) / ED Diagnoses   Final diagnoses:  Chest  pain, unspecified type    ED Discharge Orders    None       Amaryllis Dyke, PA-C 02/07/18 0755    Fatima Blank, MD 02/08/18 [phone]

## 2018-02-07 NOTE — ED Notes (Signed)
Patient transported to CT 

## 2018-02-08 DIAGNOSIS — R0789 Other chest pain: Secondary | ICD-10-CM | POA: Diagnosis not present

## 2018-02-08 DIAGNOSIS — I2510 Atherosclerotic heart disease of native coronary artery without angina pectoris: Secondary | ICD-10-CM | POA: Diagnosis not present

## 2018-02-08 DIAGNOSIS — R002 Palpitations: Secondary | ICD-10-CM | POA: Diagnosis not present

## 2018-02-08 DIAGNOSIS — R55 Syncope and collapse: Secondary | ICD-10-CM | POA: Diagnosis not present

## 2018-02-24 DIAGNOSIS — I10 Essential (primary) hypertension: Secondary | ICD-10-CM | POA: Diagnosis not present

## 2018-02-24 DIAGNOSIS — E782 Mixed hyperlipidemia: Secondary | ICD-10-CM | POA: Diagnosis not present

## 2018-03-03 DIAGNOSIS — Z23 Encounter for immunization: Secondary | ICD-10-CM | POA: Diagnosis not present

## 2018-03-03 DIAGNOSIS — E782 Mixed hyperlipidemia: Secondary | ICD-10-CM | POA: Diagnosis not present

## 2018-03-03 DIAGNOSIS — C73 Malignant neoplasm of thyroid gland: Secondary | ICD-10-CM | POA: Diagnosis not present

## 2018-03-03 DIAGNOSIS — I10 Essential (primary) hypertension: Secondary | ICD-10-CM | POA: Diagnosis not present

## 2018-03-03 DIAGNOSIS — Z0000 Encounter for general adult medical examination without abnormal findings: Secondary | ICD-10-CM | POA: Diagnosis not present

## 2018-03-09 DIAGNOSIS — E119 Type 2 diabetes mellitus without complications: Secondary | ICD-10-CM | POA: Diagnosis not present

## 2018-06-11 DIAGNOSIS — J189 Pneumonia, unspecified organism: Secondary | ICD-10-CM | POA: Diagnosis not present

## 2018-06-18 ENCOUNTER — Emergency Department (HOSPITAL_COMMUNITY): Payer: Medicare Other

## 2018-06-18 ENCOUNTER — Encounter (HOSPITAL_COMMUNITY): Payer: Self-pay | Admitting: Emergency Medicine

## 2018-06-18 ENCOUNTER — Emergency Department (HOSPITAL_COMMUNITY)
Admission: EM | Admit: 2018-06-18 | Discharge: 2018-06-19 | Disposition: A | Discharge status: Home / Self Care | Payer: Medicare Other | Attending: Emergency Medicine | Admitting: Emergency Medicine

## 2018-06-18 ENCOUNTER — Other Ambulatory Visit: Payer: Self-pay

## 2018-06-18 DIAGNOSIS — Z79899 Other long term (current) drug therapy: Secondary | ICD-10-CM | POA: Diagnosis not present

## 2018-06-18 DIAGNOSIS — R079 Chest pain, unspecified: Secondary | ICD-10-CM | POA: Diagnosis not present

## 2018-06-18 DIAGNOSIS — R0602 Shortness of breath: Secondary | ICD-10-CM | POA: Diagnosis not present

## 2018-06-18 DIAGNOSIS — E039 Hypothyroidism, unspecified: Secondary | ICD-10-CM | POA: Insufficient documentation

## 2018-06-18 DIAGNOSIS — R05 Cough: Secondary | ICD-10-CM | POA: Diagnosis not present

## 2018-06-18 DIAGNOSIS — E876 Hypokalemia: Secondary | ICD-10-CM | POA: Insufficient documentation

## 2018-06-18 LAB — BASIC METABOLIC PANEL
Anion gap: 12 (ref 5–15)
BUN: 12 mg/dL (ref 6–20)
CO2: 23 mmol/L (ref 22–32)
Calcium: 8.9 mg/dL (ref 8.9–10.3)
Chloride: 106 mmol/L (ref 98–111)
Creatinine, Ser: 0.94 mg/dL (ref 0.44–1.00)
GFR calc Af Amer: >60 mL/min (ref >60)
GFR calc non Af Amer: >60 mL/min (ref >60)
Glucose, Bld: 179 mg/dL — ABNORMAL HIGH (ref 70–99)
Potassium: 3.1 mmol/L — ABNORMAL LOW (ref 3.5–5.1)
Sodium: 141 mmol/L (ref 135–145)

## 2018-06-18 LAB — CBC WITH DIFFERENTIAL/PLATELET
Abs Immature Granulocytes: 0.05 10*3/uL (ref 0.00–0.07)
Basophils Absolute: 0 10*3/uL (ref 0.0–0.1)
Basophils Relative: 0 %
Eosinophils Absolute: 0 10*3/uL (ref 0.0–0.5)
Eosinophils Relative: 0 %
HCT: 48.4 % — ABNORMAL HIGH (ref 36.0–46.0)
Hemoglobin: 15.4 g/dL — ABNORMAL HIGH (ref 12.0–15.0)
Immature Granulocytes: 1 %
Lymphocytes Relative: 24 %
Lymphs Abs: 2.1 10*3/uL (ref 0.7–4.0)
MCH: 31.8 pg (ref 26.0–34.0)
MCHC: 31.8 g/dL (ref 30.0–36.0)
MCV: 99.8 fL (ref 80.0–100.0)
Monocytes Absolute: 0.6 10*3/uL (ref 0.1–1.0)
Monocytes Relative: 7 %
Neutro Abs: 5.8 10*3/uL (ref 1.7–7.7)
Neutrophils Relative %: 68 %
Platelets: 327 10*3/uL (ref 150–400)
RBC: 4.85 MIL/uL (ref 3.87–5.11)
RDW: 13.2 % (ref 11.5–15.5)
WBC: 8.6 10*3/uL (ref 4.0–10.5)
nRBC: 0 % (ref 0.0–0.2)

## 2018-06-18 LAB — I-STAT TROPONIN, ED: Troponin i, poc: 0 ng/mL (ref 0.00–0.08)

## 2018-06-18 MED ORDER — FENTANYL CITRATE (PF) 100 MCG/2ML IJ SOLN
50.0000 ug | Freq: Once | INTRAMUSCULAR | Status: AC
  Administered 2018-06-19: 50 ug via INTRAVENOUS
  Filled 2018-06-18: qty 2

## 2018-06-18 MED ORDER — METHOCARBAMOL 500 MG PO TABS: 500.0000 mg | ORAL_TABLET | Freq: Three times a day (TID) | ORAL | 0 refills | Status: DC | PRN

## 2018-06-18 MED ORDER — POTASSIUM CHLORIDE CRYS ER 20 MEQ PO TBCR
40.0000 meq | EXTENDED_RELEASE_TABLET | Freq: Once | ORAL | Status: AC
  Administered 2018-06-19: 40 meq via ORAL
  Filled 2018-06-18: qty 2

## 2018-06-18 MED ORDER — KETOROLAC TROMETHAMINE 15 MG/ML IJ SOLN: 15.0000 mg | Freq: Once | INTRAMUSCULAR | Status: DC

## 2018-06-18 MED ORDER — SODIUM CHLORIDE 0.9 % IV BOLUS
500.0000 mL | Freq: Once | INTRAVENOUS | Status: DC
  Administered 2018-06-18: 500 mL via INTRAVENOUS

## 2018-06-18 MED ORDER — POTASSIUM CHLORIDE CRYS ER 20 MEQ PO TBCR: 20.0000 meq | EXTENDED_RELEASE_TABLET | Freq: Every day | ORAL | 0 refills | Status: DC

## 2018-06-18 MED ORDER — FLUTICASONE PROPIONATE 50 MCG/ACT NA SUSP: 1.0000 | Freq: Every day | NASAL | 0 refills | Status: DC

## 2018-06-18 NOTE — ED Triage Notes (Signed)
Patient reports taking oral abx x1 week after being diagnosed with pneumonia. Reports continued pain in left lung. States cough has improved.

## 2018-06-18 NOTE — ED Provider Notes (Signed)
[facility] DEPT Provider Note   CSN: [id number] Arrival date & time: 06/18/18  2054    History   Chief Complaint Chief Complaint  Patient presents with  . Pneumonia    HPI Megan KESINGER is a 48 y.o. female  with a history of anxiety, thyroid cancer status post thyroidectomy who presents to the ED from UC with complaints of persistent URI symptoms and chest pain. Patient was seen [facility] approximately 1 week prior (after having felt poorly for 2 weeks prior to that) and was diagnosed with a left lower lobe pneumonia.  She states that they placed her on a Z-Pak as well as 1000 mg of amoxicillin 3 times daily for 7 days.  She states that with these medicines they also provided her with Tessalon and an inhaler for shortness of breath/wheezing.  She states that since taking the antibiotic she has not had significant improvement.  She states she has persistent congestion, dry cough, occasional shortness of breath, sometimes a sensation of wheezing, as well as subjective fever/chills.  She states that last night while she was sitting on the couch she started to have pain in the left lower chest, she describes this as "lung pain" she states is a burning pain that is worse with twisting/turning movements and with coughing.  No alleviating factors.  No change with exertion.  Denies syncope, emesis, sore throat, ear pain, leg pain/swelling, hemoptysis, recent surgery/trauma, recent long travel, hormone use, or hx of DVT/PE.  She does have a history of thyroid cancer status post thyroidectomy in 2005-has been in remission since without active chemotherapy or radiation recently.     HPI  Past Medical History:  Diagnosis Date  . Anxiety   . Arthritis   . History of kidney stones   . Hypoglycemia   . Hypothyroidism   . Obesity   . Thyroid cancer ([place])   . Thyroid disease     Patient Active Problem List   Diagnosis Date Noted  . Loss of weight   . Nausea  without vomiting   . Thyroid cancer ([place]) 09/05/2016  . Dysphagia 09/04/2016  . Nausea 09/04/2016  . Vulvar hyperplasia 09/22/2013    Past Surgical History:  Procedure Laterality Date  . ESOPHAGOGASTRODUODENOSCOPY (EGD) WITH PROPOFOL N/A 12/23/2016   Procedure: ESOPHAGOGASTRODUODENOSCOPY (EGD) WITH PROPOFOL;  Surgeon: Ladene Artist, MD;  Location: [facility];  Service: Endoscopy;  Laterality: N/A;  . HEEL SPUR SURGERY Left   . LITHOTRIPSY    . PARTIAL HYSTERECTOMY    . rotator cuff surgery Bilateral   . THYROIDECTOMY    . TONSILLECTOMY AND ADENOIDECTOMY     and adenoidectomy      OB History    Gravida  0   Para  0   Term  0   Preterm  0   AB  0   Living  0     SAB  0   TAB  0   Ectopic  0   Multiple  0   Live Births               Home Medications    Prior to Admission medications   Medication Sig Start Date End Date Taking? Authorizing Provider  clonazePAM (KLONOPIN) 0.5 MG tablet Take 1 mg by mouth daily as needed for anxiety.    [provider]  ezetimibe (ZETIA) 10 MG tablet Take 10 mg by mouth daily.    [provider]  levothyroxine (  SYNTHROID, LEVOTHROID) 112 MCG tablet Take 224 mcg by mouth daily before breakfast.    [provider]  LITHOBID 300 MG CR tablet Take 600 mg by mouth at bedtime.  08/25/13   [provider]  Multiple Vitamin (DAILY VITAMIN PO) Take 1 tablet by mouth daily.    [provider]    Family History Family History  Problem Relation Age of Onset  . Ovarian cancer Mother   . Colon cancer Neg Hx   . Esophageal cancer Neg Hx   . Pancreatic cancer Neg Hx   . Stomach cancer Neg Hx   . Liver disease Neg Hx   . Thyroid cancer Neg Hx   . Thyroid disease Neg Hx     Social History Social History   Tobacco Use  . Smoking status: Never Smoker  . Smokeless tobacco: Never Used  Substance Use Topics  . Alcohol use: No  . Drug use: No     Allergies   Myrbetriq [mirabegron];  Morphine and related; and Omeprazole   Review of Systems Review of Systems Constitutional: Positive for chills and fever.  HENT: Positive for congestion. Negative for ear pain and sore throat.   Respiratory: Positive for cough, shortness of breath and wheezing.   Cardiovascular: Positive for chest pain. Negative for leg swelling.  Gastrointestinal: Negative for abdominal pain, diarrhea and vomiting.  Genitourinary: Negative for dysuria.  Neurological: Negative for syncope.  All other systems reviewed and are negative.  Physical Exam Updated Vital Signs BP (!) 183/111 (BP Location: Left Arm)   Pulse 88   Temp 98.4 F (36.9 C) (Oral)   Resp (!) 22   SpO2 99%   Physical Exam Vitals signs and nursing note reviewed.  Constitutional:      General: She is not in acute distress.    Appearance: She is well-developed. She is obese.  HENT:     Head: Normocephalic and atraumatic.     Right Ear: External ear normal. Tympanic membrane is not perforated, erythematous, retracted or bulging.     Left Ear: External ear normal. Tympanic membrane is not perforated, erythematous, retracted or bulging.     Nose: Congestion present.     Comments: NO sinus tenderness.     Mouth/Throat:     Pharynx: Uvula midline. No oropharyngeal exudate or posterior oropharyngeal erythema.  Eyes:     General:        Right eye: No discharge.        Left eye: No discharge.     Conjunctiva/sclera: Conjunctivae normal.     Pupils: Pupils are equal, round, and reactive to light.  Neck:     Musculoskeletal: Normal range of motion and neck supple.  Cardiovascular:     Rate and Rhythm: Normal rate and regular rhythm.     Heart sounds: No murmur.  Pulmonary:     Effort: No respiratory distress.     Breath sounds: Decreased breath sounds (throughout) present. No wheezing.  Chest:     Chest wall: Tenderness (L lower chest wall- reproduces patient's pain, no overlying skin changes or palpable crepitus) present.   Abdominal:     General: There is no distension.     Palpations: Abdomen is soft.     Tenderness: There is no abdominal tenderness.  Lymphadenopathy:     Cervical: No cervical adenopathy.  Skin:    General: Skin is warm and dry.     Findings: No rash.  Neurological:     Mental Status: She is  alert.  Psychiatric:        Behavior: Behavior normal.   ED Treatments / Results  Labs (all labs ordered are listed, but only abnormal results are displayed) Labs Reviewed  CBC WITH DIFFERENTIAL/PLATELET - Abnormal; Notable for the following components:      Result Value   Hemoglobin 15.4 (*)    HCT 48.4 (*)    All other components within normal limits  BASIC METABOLIC PANEL - Abnormal; Notable for the following components:   Potassium 3.1 (*)    Glucose, Bld 179 (*)    All other components within normal limits  I-STAT TROPONIN, ED    EKG EKG Interpretation  Date/Time:  Friday June 18 2018 21:53:29 EST Ventricular Rate:  77 PR Interval:    QRS Duration: 82 QT Interval:  385 QTC Calculation: 436 R Axis:   -10 Text Interpretation:  Sinus rhythm Low voltage, precordial leads no change from previous Confirmed by Charlesetta Shanks [phone]) on 06/18/2018 10:36:16 PM   Radiology Dg Chest 2 View  Result Date: 06/18/2018 CLINICAL DATA:  Cough, shortness breath, chest pain EXAM: CHEST - 2 VIEW COMPARISON:  02/07/2018 FINDINGS: Low lung volumes with accentuation of heart size. Bibasilar atelectasis. No effusions or acute bony abnormality. IMPRESSION: Low lung volumes, bibasilar atelectasis. Electronically Signed   By: Rolm Baptise M.D.   On: 06/18/2018 22:23    Procedures Procedures (including critical care time)  Medications Ordered in ED Medications  fentaNYL (SUBLIMAZE) injection 50 mcg (has no administration in time range)  potassium chloride SA (K-DUR,KLOR-CON) CR tablet 40 mEq (has no administration in time range)     Initial Impression / Assessment and Plan / ED Course  I  have reviewed the triage vital signs and the nursing notes.  Pertinent labs & imaging results that were available during my care of the patient were reviewed by me and considered in my medical decision making (see chart for details).   Patient presents with continued URI symptoms after completion of treatment for community-acquired pneumonia now also with some left lower chest discomfort.  Patient nontoxic-appearing, no apparent distress, vitals notable for elevated blood pressure, this seems similar to prior ER visit, doubt hypertensive emergency at this time.  On exam she has some decreased breath sounds but no focal adventitious sounds auscultated, no wheezing to necessitate albuterol or steroids.  Her chest discomfort is reproducible with chest wall palpation-rash or palpable crepitus.  Will proceed with basic labs, EKG and troponin and chest x-ray. Fentanyl ordered for pain.  Low risk [place], doubt PE, additionally I saw this patient a few months prior for chest pain, she had a negative CTA at that time.  Work-up reviewed:  CBC: No leukocytosis or anemia.  BMP: Mild hypokalemia- oral replacement. Glucose elevated- PCP recheck, does not consistent with DKA.  Initial troponin: Negative Chest x-ray: Bibasilar atelectasis, no infiltrate, effusion, edema, cardiomegaly, pneumothorax, or fracture/dislocation.  23:20: RE-EVAL: Patient feeling somewhat improved following fluids, had not received fentanyl yet, remains with reproducibility of pain with palpation to left lower chest wall.  Oxygen saturation remains greater than 95%, heart rate remains within normal limits.  Pending fentanyl.  Plan for ambulatory SPO2, delta troponin, and reassessment.  Pending delta troponin is negative and patient does not desat with ambulation suspect she can be discharged home with close PCP follow-up and strict return precautions.  Results and plan of care thus far discussed with patient and her mother at bedside,  provided opportunity for questions, patient and her mother are  in agreement with plan and confirmed understanding.  23:30: Care transition to [place] PA-C at change of shift pending follow up on repeat trop, ambulatory SpO2, re-assessment, and appropriate disposition.     Findings and plan of care discussed with supervising physician Dr. Johnney Killian who is in agreement.    Final Clinical Impressions(s) / ED Diagnoses   Final diagnoses:  Chest pain, unspecified type  Hypokalemia    ED Discharge Orders    None       Amaryllis Dyke, PA-C 06/18/18 2345    Charlesetta Shanks, MD 06/22/18 [phone]

## 2018-06-18 NOTE — ED Provider Notes (Signed)
48 year old female received a signout from [facility] pending delta troponin.  Per her HPI:  "Megan Moreno is a 48 y.o. female with a history of anxiety, thyroid cancer status post thyroidectomywho presents to the ED from UC with complaints ofpersistent URI symptoms and chest pain. Patient was seen [facility] approximately 1 week prior (after having felt poorly for 2 weeks prior to that) and was diagnosed with a left lower lobe pneumonia.She states that they placed her on a Z-Pak as well as 1000 mg of amoxicillin 3 times daily for 7 days. She states that with these medicines they also provided her with Tessalon and an inhaler for shortness of breath/wheezing. She states that since taking the antibiotic she has not had significant improvement. She states she has persistent congestion, dry cough, occasional shortness of breath, sometimes a sensation of wheezing, as well as subjective fever/chills. She states that last night while she was sitting on the couch she started to have pain in the left lower chest, she describes this as "lung pain" she states is a burning pain that is worse with twisting/turning movements and with coughing. No alleviating factors. No change with exertion. Denies syncope, emesis, sore throat, ear pain,leg pain/swelling, hemoptysis, recent surgery/trauma, recent long travel, hormone use, or hx of DVT/PE.She does have a history of thyroid cancer status post thyroidectomy in 2005-has been in remission since without active chemotherapy or radiation recently."   Physical Exam  BP 140/81 (BP Location: Left Arm)   Pulse 75   Temp 98.4 F (36.9 C) (Oral)   Resp 20   SpO2 98%   Physical Exam  No tachypnea.  Expiratory wheezes noted bilaterally.  No accessory muscle use or retractions.  Speaks in complete, fluent sentences.  ED Course/Procedures     Procedures  MDM   48 y.o. female with a history of anxiety, thyroid cancer status post thyroidectomy  received a signout from PA Petrucelli pending delta troponin.  Repeat troponin is normal.  On my exam, she has expiratory wheezes bilaterally.  Will treat with DuoNeb.  Patient was educated on her home albuterol.  SaO2 remained stable.  She ambulated and maintained an oxygen saturation of 95 to 97% with no complaints in the ER prior to discharge.  Will plan for discharge to home with Flonase, potassium chloride, and Robaxin per PA Petrucelli's plan.  Strict return precautions given.  She is hemodynamically stable and no acute distress.  Safe for discharge home with outpatient follow-up at this time.    Joanne Gavel, PA-C 06/19/18 0850    Fatima Blank, MD 06/20/18 [phone]

## 2018-06-18 NOTE — Discharge Instructions (Addendum)
You were seen in the ER today for continued respiratory infection type symptoms and chest pain.  Your chest x-ray does not show pneumonia anymore.  Your labs were fairly reassuring.  Your potassium was a bit low, we supplemented this in the ER and are sending you with a prescription for this.  We also gave you diet recommendations as well.  Your blood sugar was high at 179, this will need to be rechecked by primary care. Your blood pressure was also high- have this monitored and rechecked by primary care as well. Your EKG did not show an acute heart attack.   At this time we suspect that you are having some chest discomfort due to irritation of the lung lining and muscle irritation.  We are sending you home with the following medicines to help your symptoms.  - Flonase- to help with nasal congestion- use 1 spray per nostril, daily - Kdur- potassium supplement- take daily for 3 days.  - Robaxin is the muscle relaxer I have prescribed, this is meant to help with muscle tightness. Be aware that this medication may make you drowsy therefore the first time you take this it should be at a time you are in an environment where you can rest. Do not drive or operate heavy machinery when taking this medication. Do not drink alcohol or take other sedating medications with this medicine such as narcotics or benzodiazepines.   You make take Tylenol per over the counter dosing with these medications.   We have prescribed you new medication(s) today. Discuss the medications prescribed today with your pharmacist as they can have adverse effects and interactions with your other medicines including over the counter and prescribed medications. Seek medical evaluation if you start to experience new or abnormal symptoms after taking one of these medicines, seek care immediately if you start to experience difficulty breathing, feeling of your throat closing, facial swelling, or rash as these could be indications of a more  serious allergic reaction.  Please follow-up closely with your primary care provider within 3 to 5 days for reassessment of your symptoms and for the above lab abnormalities and blood pressure recheck.  Return to the ER immediately for new or worsening symptoms including but not limited to worsening pain, trouble breathing, inability to keep fluids down, fever (temp above 100.4),passing out or any other concerns.

## 2018-06-19 DIAGNOSIS — E876 Hypokalemia: Secondary | ICD-10-CM | POA: Diagnosis not present

## 2018-06-19 LAB — TROPONIN I: Troponin I: <0.03 ng/mL (ref <0.03)

## 2018-06-19 MED ORDER — ALBUTEROL SULFATE (2.5 MG/3ML) 0.083% IN NEBU
5.0000 mg | INHALATION_SOLUTION | Freq: Once | RESPIRATORY_TRACT | Status: AC
  Administered 2018-06-19: 5 mg via RESPIRATORY_TRACT
  Filled 2018-06-19: qty 6

## 2018-06-19 MED ORDER — IPRATROPIUM BROMIDE 0.02 % IN SOLN
0.5000 mg | Freq: Once | RESPIRATORY_TRACT | Status: AC
  Administered 2018-06-19: 0.5 mg via RESPIRATORY_TRACT
  Filled 2018-06-19: qty 2.5

## 2018-06-19 MED ORDER — FLUCONAZOLE 150 MG PO TABS: 150.0000 mg | ORAL_TABLET | Freq: Every day | ORAL | 0 refills | Status: AC

## 2018-06-19 NOTE — ED Notes (Signed)
Pt ambulated to restroom 30 plus ft. Pt O2 saturation maintained between 95 and 97% with no complaints.

## 2018-09-02 DIAGNOSIS — I10 Essential (primary) hypertension: Secondary | ICD-10-CM | POA: Diagnosis not present

## 2018-09-02 DIAGNOSIS — R300 Dysuria: Secondary | ICD-10-CM | POA: Diagnosis not present

## 2018-09-02 DIAGNOSIS — E782 Mixed hyperlipidemia: Secondary | ICD-10-CM | POA: Diagnosis not present

## 2018-09-02 DIAGNOSIS — E1165 Type 2 diabetes mellitus with hyperglycemia: Secondary | ICD-10-CM | POA: Diagnosis not present

## 2018-09-29 DIAGNOSIS — N390 Urinary tract infection, site not specified: Secondary | ICD-10-CM | POA: Diagnosis not present

## 2018-10-21 ENCOUNTER — Emergency Department (HOSPITAL_COMMUNITY): Payer: Medicare Other

## 2018-10-21 ENCOUNTER — Other Ambulatory Visit: Payer: Self-pay

## 2018-10-21 ENCOUNTER — Encounter: Payer: Self-pay | Admitting: Physician Assistant

## 2018-10-21 ENCOUNTER — Ambulatory Visit (INDEPENDENT_AMBULATORY_CARE_PROVIDER_SITE_OTHER): Payer: Medicare Other | Admitting: Physician Assistant

## 2018-10-21 ENCOUNTER — Encounter (HOSPITAL_COMMUNITY): Payer: Self-pay | Admitting: Emergency Medicine

## 2018-10-21 ENCOUNTER — Emergency Department (HOSPITAL_COMMUNITY)
Admission: EM | Admit: 2018-10-21 | Discharge: 2018-10-21 | Disposition: A | Discharge status: Home / Self Care | Payer: Medicare Other | Attending: Emergency Medicine | Admitting: Emergency Medicine

## 2018-10-21 VITALS — BP 116/72 | HR 87 | Temp 98.7°F | Ht 59.0 in | Wt 357.4 lb

## 2018-10-21 DIAGNOSIS — Z79899 Other long term (current) drug therapy: Secondary | ICD-10-CM | POA: Insufficient documentation

## 2018-10-21 DIAGNOSIS — Y9301 Activity, walking, marching and hiking: Secondary | ICD-10-CM | POA: Insufficient documentation

## 2018-10-21 DIAGNOSIS — Z85850 Personal history of malignant neoplasm of thyroid: Secondary | ICD-10-CM | POA: Insufficient documentation

## 2018-10-21 DIAGNOSIS — Y929 Unspecified place or not applicable: Secondary | ICD-10-CM | POA: Insufficient documentation

## 2018-10-21 DIAGNOSIS — W19XXXA Unspecified fall, initial encounter: Secondary | ICD-10-CM

## 2018-10-21 DIAGNOSIS — E039 Hypothyroidism, unspecified: Secondary | ICD-10-CM | POA: Diagnosis not present

## 2018-10-21 DIAGNOSIS — W010XXA Fall on same level from slipping, tripping and stumbling without subsequent striking against object, initial encounter: Secondary | ICD-10-CM | POA: Insufficient documentation

## 2018-10-21 DIAGNOSIS — Z885 Allergy status to narcotic agent status: Secondary | ICD-10-CM | POA: Diagnosis not present

## 2018-10-21 DIAGNOSIS — R350 Frequency of micturition: Secondary | ICD-10-CM | POA: Diagnosis not present

## 2018-10-21 DIAGNOSIS — S0990XA Unspecified injury of head, initial encounter: Secondary | ICD-10-CM | POA: Diagnosis not present

## 2018-10-21 DIAGNOSIS — S060X1A Concussion with loss of consciousness of 30 minutes or less, initial encounter: Secondary | ICD-10-CM | POA: Insufficient documentation

## 2018-10-21 DIAGNOSIS — Z888 Allergy status to other drugs, medicaments and biological substances status: Secondary | ICD-10-CM | POA: Insufficient documentation

## 2018-10-21 DIAGNOSIS — Y999 Unspecified external cause status: Secondary | ICD-10-CM | POA: Insufficient documentation

## 2018-10-21 LAB — BASIC METABOLIC PANEL
Anion gap: 11 (ref 5–15)
BUN: 13 mg/dL (ref 6–20)
CO2: 27 mmol/L (ref 22–32)
Calcium: 9.3 mg/dL (ref 8.9–10.3)
Chloride: 102 mmol/L (ref 98–111)
Creatinine, Ser: 1.04 mg/dL — ABNORMAL HIGH (ref 0.44–1.00)
GFR calc Af Amer: >60 mL/min (ref >60)
GFR calc non Af Amer: >60 mL/min (ref >60)
Glucose, Bld: 192 mg/dL — ABNORMAL HIGH (ref 70–99)
Potassium: 3.7 mmol/L (ref 3.5–5.1)
Sodium: 140 mmol/L (ref 135–145)

## 2018-10-21 LAB — POCT URINALYSIS DIPSTICK
Bilirubin, UA: NEGATIVE
Blood, UA: POSITIVE
Glucose, UA: NEGATIVE
Ketones, UA: NEGATIVE
Leukocytes, UA: NEGATIVE
Nitrite, UA: NEGATIVE
Protein, UA: NEGATIVE
Spec Grav, UA: 1.01 (ref 1.010–1.025)
Urobilinogen, UA: 0.2 E.U./dL
pH, UA: 6.5 (ref 5.0–8.0)

## 2018-10-21 LAB — CBC
HCT: 45.3 % (ref 36.0–46.0)
Hemoglobin: 14.7 g/dL (ref 12.0–15.0)
MCH: 33 pg (ref 26.0–34.0)
MCHC: 32.5 g/dL (ref 30.0–36.0)
MCV: 101.6 fL — ABNORMAL HIGH (ref 80.0–100.0)
Platelets: 217 10*3/uL (ref 150–400)
RBC: 4.46 MIL/uL (ref 3.87–5.11)
RDW: 13.2 % (ref 11.5–15.5)
WBC: 7.9 10*3/uL (ref 4.0–10.5)
nRBC: 0 % (ref 0.0–0.2)

## 2018-10-21 LAB — CBG MONITORING, ED: Glucose-Capillary: 174 mg/dL — ABNORMAL HIGH (ref 70–99)

## 2018-10-21 LAB — I-STAT BETA HCG BLOOD, ED (MC, WL, AP ONLY): I-stat hCG, quantitative: <5 m[IU]/mL (ref <5)

## 2018-10-21 MED ORDER — SODIUM CHLORIDE 0.9% FLUSH: 3.0000 mL | Freq: Once | INTRAVENOUS | Status: DC

## 2018-10-21 MED ORDER — CEPHALEXIN 500 MG PO CAPS: 500.0000 mg | ORAL_CAPSULE | Freq: Three times a day (TID) | ORAL | 0 refills | Status: DC

## 2018-10-21 MED ORDER — CEPHALEXIN 500 MG PO CAPS: 500.0000 mg | ORAL_CAPSULE | Freq: Three times a day (TID) | ORAL | 0 refills | Status: AC

## 2018-10-21 NOTE — ED Provider Notes (Signed)
[facility] EMERGENCY DEPARTMENT Provider Note   CSN: [id number] Arrival date & time: 10/21/18  1352    History   Chief Complaint Chief Complaint  Patient presents with  . Fall    HPI Megan Moreno is a 48 y.o. female with a past medical history of thyroid disease, obesity, kidney stones, who presents today for evaluation of a mechanical fall.  She reports that on Tuesday she was walking and hit her foot on something causing her to trip.  She states that she hit her head, she thinks she struck the back of her head is that is where she is sore.  She reports that she was unconscious for 1 or 2 minutes.  Today she went to her primary care doctor for evaluation of a possible urinary tract infection, and they referred her here for striking her head.  She denies any prodromal symptoms.  She did report feeling slightly dizzy after however that has resolved.  She denies any fevers, does not wish for her urinary complaints to be evaluated in the emergency room today as they have been addressed by her primary care doctor.  She does not take any blood thinning medications.   Chart review shows that her PCP had already performed a POC UA and sent for culture.    HPI  Past Medical History:  Diagnosis Date  . Anxiety   . Arthritis   . History of kidney stones   . Hypoglycemia   . Hypothyroidism   . Obesity   . Thyroid cancer ([place])   . Thyroid disease     Patient Active Problem List   Diagnosis Date Noted  . Loss of weight   . Nausea without vomiting   . Thyroid cancer ([place]) 09/05/2016  . Dysphagia 09/04/2016  . Nausea 09/04/2016  . Vulvar hyperplasia 09/22/2013    Past Surgical History:  Procedure Laterality Date  . ESOPHAGOGASTRODUODENOSCOPY (EGD) WITH PROPOFOL N/A 12/23/2016   Procedure: ESOPHAGOGASTRODUODENOSCOPY (EGD) WITH PROPOFOL;  Surgeon: Ladene Artist, MD;  Location: [facility];  Service: Endoscopy;  Laterality: N/A;  . HEEL SPUR SURGERY Left   .  LITHOTRIPSY    . PARTIAL HYSTERECTOMY    . rotator cuff surgery Bilateral   . THYROIDECTOMY    . TONSILLECTOMY AND ADENOIDECTOMY     and adenoidectomy      OB History    Gravida  0   Para  0   Term  0   Preterm  0   AB  0   Living  0     SAB  0   TAB  0   Ectopic  0   Multiple  0   Live Births               Home Medications    Prior to Admission medications   Medication Sig Start Date End Date Taking? Authorizing Provider  cephALEXin (KEFLEX) 500 MG capsule Take 1 capsule (500 mg total) by mouth 3 (three) times daily for 5 days. 10/21/18 10/26/18  Inda Coke, PA  clonazePAM (KLONOPIN) 0.5 MG tablet Take 1 mg by mouth daily as needed for anxiety.    [provider]  diphenhydrAMINE (BENADRYL) 50 MG capsule Take 50 mg by mouth every 6 (six) hours as needed.    [provider]  ezetimibe (ZETIA) 10 MG tablet Take 10 mg by mouth every evening.     [provider]  fluticasone (FLONASE) 50 MCG/ACT nasal spray Place 1 spray into  both nostrils daily. Patient not taking: Reported on 10/21/2018 06/18/18   Petrucelli, Glynda Jaeger, PA-C  Glucosamine-Chondroit-Vit C-Mn (GLUCOSAMINE 1500 COMPLEX PO) Take 2 tablets by mouth daily.    [provider]  levothyroxine (SYNTHROID, LEVOTHROID) 112 MCG tablet Take 224 mcg by mouth daily before breakfast.    [provider]  LITHOBID 300 MG CR tablet Take 600 mg by mouth at bedtime.  08/25/13   [provider]  methocarbamol (ROBAXIN) 500 MG tablet Take 1 tablet (500 mg total) by mouth every 8 (eight) hours as needed. Patient not taking: Reported on 10/21/2018 06/18/18   Petrucelli, Aldona Bar R, PA-C  Multiple Vitamin (DAILY VITAMIN PO) Take 1 tablet by mouth daily.    [provider]  potassium chloride SA (K-DUR,KLOR-CON) 20 MEQ tablet Take 1 tablet (20 mEq total) by mouth daily. 06/18/18   Petrucelli, Glynda Jaeger, PA-C    Family History Family History  Problem Relation Age  of Onset  . Ovarian cancer Mother   . Colon cancer Neg Hx   . Esophageal cancer Neg Hx   . Pancreatic cancer Neg Hx   . Stomach cancer Neg Hx   . Liver disease Neg Hx   . Thyroid cancer Neg Hx   . Thyroid disease Neg Hx     Social History Social History   Tobacco Use  . Smoking status: Never Smoker  . Smokeless tobacco: Never Used  Substance Use Topics  . Alcohol use: No  . Drug use: No     Allergies   Myrbetriq [mirabegron], Morphine and related, and Omeprazole   Review of Systems Review of Systems  Constitutional: Negative for chills and fever.  HENT: Negative for congestion.   Eyes: Negative for photophobia.  Gastrointestinal: Negative for abdominal pain.  Genitourinary: Positive for dysuria. Negative for flank pain.  Musculoskeletal: Negative for back pain and neck pain.  Neurological: Positive for dizziness (Briefly after fell and hit head.  None since) and headaches. Negative for weakness and numbness.  Psychiatric/Behavioral: Negative for confusion.  All other systems reviewed and are negative.    Physical Exam Updated Vital Signs BP (!) 153/82 (BP Location: Left Wrist)   Pulse 77   Temp 98.5 F (36.9 C) (Oral)   Resp 17   Ht 4\' 11"  (1.499 m)   Wt (!) 181.4 kg   LMP  (LMP Unknown)   SpO2 97%   BMI 80.79 kg/m   Physical Exam Vitals signs and nursing note reviewed.  Constitutional:      General: She is not in acute distress.    Appearance: She is well-developed. She is obese.  HENT:     Head: Normocephalic and atraumatic. No contusion or masses.     Comments: No raccoon's eyes or battle signs bilaterally.  Eyes:     Conjunctiva/sclera: Conjunctivae normal.  Neck:     Musculoskeletal: Neck supple.  Cardiovascular:     Rate and Rhythm: Normal rate and regular rhythm.     Heart sounds: No murmur.  Pulmonary:     Effort: Pulmonary effort is normal. No respiratory distress.     Breath sounds: Normal breath sounds.  Abdominal:     Palpations:  Abdomen is soft.     Tenderness: There is no abdominal tenderness.  Skin:    General: Skin is warm and dry.  Neurological:     General: No focal deficit present.     Mental Status: She is alert.     GCS: GCS eye subscore is 4. GCS  verbal subscore is 5. GCS motor subscore is 6.     Comments: 5/5 strength in bilateral upper and lower extremities.  Cranial nerves grossly intact on my exam.  She is awake and alert.  No evidence of ataxia.  Psychiatric:        Attention and Perception: Attention and perception normal.        Mood and Affect: Mood normal.        Behavior: Behavior normal.      ED Treatments / Results  Labs (all labs ordered are listed, but only abnormal results are displayed) Labs Reviewed  BASIC METABOLIC PANEL - Abnormal; Notable for the following components:      Result Value   Glucose, Bld 192 (*)    Creatinine, Ser 1.04 (*)    All other components within normal limits  CBC - Abnormal; Notable for the following components:   MCV 101.6 (*)    All other components within normal limits  CBG MONITORING, ED - Abnormal; Notable for the following components:   Glucose-Capillary 174 (*)    All other components within normal limits  I-STAT BETA HCG BLOOD, ED ([facility], [facility], [facility] ONLY)    EKG None  Radiology Ct Head Wo Contrast  Result Date: 10/21/2018 CLINICAL DATA:  Head injury after fall. EXAM: CT HEAD WITHOUT CONTRAST CT CERVICAL SPINE WITHOUT CONTRAST TECHNIQUE: Multidetector CT imaging of the head and cervical spine was performed following the standard protocol without intravenous contrast. Multiplanar CT image reconstructions of the cervical spine were also generated. COMPARISON:  CT scan of February 09, 2006. FINDINGS: CT HEAD FINDINGS Brain: No evidence of acute infarction, hemorrhage, hydrocephalus, extra-axial collection or mass lesion/mass effect. Vascular: No hyperdense vessel or unexpected calcification. Skull: Normal. Negative for fracture or focal lesion.  Sinuses/Orbits: No acute finding. Other: None. CT CERVICAL SPINE FINDINGS Alignment: Normal. Skull base and vertebrae: No acute fracture. No primary bone lesion or focal pathologic process. Soft tissues and spinal canal: No prevertebral fluid or swelling. No visible canal hematoma. Disc levels: Mild osteophyte formation is noted anteriorly at C5-6 and C6-7. Remaining disc spaces are unremarkable. Upper chest: Negative. Other: None. IMPRESSION: Normal head CT. Mild degenerative changes are noted in the lower cervical spine. No fracture or spondylolisthesis is noted. Electronically Signed   By: Marijo Conception M.D.   On: 10/21/2018 17:41   Ct Cervical Spine Wo Contrast  Result Date: 10/21/2018 CLINICAL DATA:  Head injury after fall. EXAM: CT HEAD WITHOUT CONTRAST CT CERVICAL SPINE WITHOUT CONTRAST TECHNIQUE: Multidetector CT imaging of the head and cervical spine was performed following the standard protocol without intravenous contrast. Multiplanar CT image reconstructions of the cervical spine were also generated. COMPARISON:  CT scan of February 09, 2006. FINDINGS: CT HEAD FINDINGS Brain: No evidence of acute infarction, hemorrhage, hydrocephalus, extra-axial collection or mass lesion/mass effect. Vascular: No hyperdense vessel or unexpected calcification. Skull: Normal. Negative for fracture or focal lesion. Sinuses/Orbits: No acute finding. Other: None. CT CERVICAL SPINE FINDINGS Alignment: Normal. Skull base and vertebrae: No acute fracture. No primary bone lesion or focal pathologic process. Soft tissues and spinal canal: No prevertebral fluid or swelling. No visible canal hematoma. Disc levels: Mild osteophyte formation is noted anteriorly at C5-6 and C6-7. Remaining disc spaces are unremarkable. Upper chest: Negative. Other: None. IMPRESSION: Normal head CT. Mild degenerative changes are noted in the lower cervical spine. No fracture or spondylolisthesis is noted. Electronically Signed   By: Marijo Conception  M.D.   On: 10/21/2018  17:41    Procedures Procedures (including critical care time)  Medications Ordered in ED Medications - No data to display   Initial Impression / Assessment and Plan / ED Course  I have reviewed the triage vital signs and the nursing notes.  Pertinent labs & imaging results that were available during my care of the patient were reviewed by me and considered in my medical decision making (see chart for details).       Patient presents today for evaluation after fall.  2 days ago she had a mechanical fall where she struck her head.  She denies any prodromal symptoms.  She was at her primary care doctor's office earlier today for possible urinary tract infection.  They obtained UA and culture.  She does not wish for that to be repeated here today.  Labs were obtained by triage which do not show evidence of an evident hematologic or electrolyte derangement.  As she did not syncopized causing her fall, rather had a mechanical fall EKG, and syncope work-up was not obtained.  CT head and neck do not show evidence of acute injury.  Suspect that she may have a very mild concussion as she continues to report soreness on her head versus a superficial contusion.  Recommended PCP follow-up.  She is given information on preventing falls at home.  She denies any other injuries from this fall.  Return precautions were discussed with patient who states their understanding.  At the time of discharge patient denied any unaddressed complaints or concerns.  Patient is agreeable for discharge home.  Final Clinical Impressions(s) / ED Diagnoses   Final diagnoses:  Fall, initial encounter  Concussion with loss of consciousness of 30 minutes or less, initial encounter    ED Discharge Orders    None       Ollen Gross 10/21/18 2154    Malvin Johns, MD 11/06/18 1218

## 2018-10-21 NOTE — ED Triage Notes (Signed)
Pt states she fell on Tuesday at home- tripped over her feet and hit her head. Pt states she felt a little dizzy prior to the incident. Pt went to MD checkup today and was advised to come here for CT. Pt denies any ongoing symptoms since the fall. Pt just complains of feeling sore.

## 2018-10-21 NOTE — ED Notes (Signed)
Patient verbalizes understanding of discharge instructions. Opportunity for questioning and answering were provided. Armband removed by staff , patient discharged from ED.

## 2018-10-21 NOTE — Discharge Instructions (Signed)
Your CT scans and blood work today were reassuring.  Your blood pressure was significantly elevated.  Please follow-up with your primary care doctor regarding your blood pressure and your urine infection.

## 2018-10-21 NOTE — Progress Notes (Signed)
Megan Moreno is a 48 y.o. female here for a new problem.  History of Present Illness:   Chief Complaint  Patient presents with  . Back Pain  . Urinary Frequency    HPI  Patient presents to the office with back pain and urinary frequency.  She is new to office, planning to establish care soon with Dr. Billey Chang. Prior PCP at other medical practice.  She reports that she has a "several month" history of urinary frequency and incontinence. She is a bit of poor historian. She states that she has seen a kidney doctor recently, that she sees for "kidney stones and does acupuncture on my heel. They sent me to the wrong doctor, when I showed up they said I was at the wrong place. I didn't like going there because my appointment was at night and I don't think anyone was really listening to me."  She states that she has lower back pain and urinary frequency. Every time she eats a different meat, she can "smell that meat in my urine." Denies fevers presently, but states "I may have had them a few months ago when all this started." Denies blood in urine. Does have known kidney stones that she was told "I will never pass."  Denies: vaginal discharge, chills, n/v  She states that she used to be diabetic but isn't anymore.  States that she has hx of UTI and that they usually aren't caught until the urine culture returns.  Past Medical History:  Diagnosis Date  . Anxiety   . Arthritis   . History of kidney stones   . Hypoglycemia   . Hypothyroidism   . Obesity   . Thyroid cancer ([place])   . Thyroid disease      Social History   Socioeconomic History  . Marital status: Single    Spouse name: Not on file  . Number of children: Not on file  . Years of education: Not on file  . Highest education level: Not on file  Occupational History  . Not on file  Social Needs  . Financial resource strain: Not on file  . Food insecurity    Worry: Not on file    Inability: Not on file  .  Transportation needs    Medical: Not on file    Non-medical: Not on file  Tobacco Use  . Smoking status: Never Smoker  . Smokeless tobacco: Never Used  Substance and Sexual Activity  . Alcohol use: No  . Drug use: No  . Sexual activity: Never  Lifestyle  . Physical activity    Days per week: Not on file    Minutes per session: Not on file  . Stress: Not on file  Relationships  . Social Herbalist on phone: Not on file    Gets together: Not on file    Attends religious service: Not on file    Active member of club or organization: Not on file    Attends meetings of clubs or organizations: Not on file    Relationship status: Not on file  . Intimate partner violence    Fear of current or ex partner: Not on file    Emotionally abused: Not on file    Physically abused: Not on file    Forced sexual activity: Not on file  Other Topics Concern  . Not on file  Social History Narrative  . Not on file    Past Surgical History:  Procedure Laterality  Date  . ESOPHAGOGASTRODUODENOSCOPY (EGD) WITH PROPOFOL N/A 12/23/2016   Procedure: ESOPHAGOGASTRODUODENOSCOPY (EGD) WITH PROPOFOL;  Surgeon: Ladene Artist, MD;  Location: [facility];  Service: Endoscopy;  Laterality: N/A;  . HEEL SPUR SURGERY Left   . LITHOTRIPSY    . PARTIAL HYSTERECTOMY    . rotator cuff surgery Bilateral   . THYROIDECTOMY    . TONSILLECTOMY AND ADENOIDECTOMY     and adenoidectomy     Family History  Problem Relation Age of Onset  . Ovarian cancer Mother   . Colon cancer Neg Hx   . Esophageal cancer Neg Hx   . Pancreatic cancer Neg Hx   . Stomach cancer Neg Hx   . Liver disease Neg Hx   . Thyroid cancer Neg Hx   . Thyroid disease Neg Hx     Allergies  Allergen Reactions  . Myrbetriq [Mirabegron] Anaphylaxis and Itching  . Morphine And Related Nausea And Vomiting  . Omeprazole     Stomach pain    Current Medications:  No current facility-administered medications for this visit.    Current Outpatient Medications:  .  cephALEXin (KEFLEX) 500 MG capsule, Take 1 capsule (500 mg total) by mouth 3 (three) times daily for 5 days., Disp: 15 capsule, Rfl: 0 .  clonazePAM (KLONOPIN) 0.5 MG tablet, Take 1 mg by mouth daily as needed for anxiety., Disp: , Rfl:  .  diphenhydrAMINE (BENADRYL) 50 MG capsule, Take 50 mg by mouth every 6 (six) hours as needed., Disp: , Rfl:  .  Glucosamine-Chondroit-Vit C-Mn (GLUCOSAMINE 1500 COMPLEX PO), Take 2 tablets by mouth daily., Disp: , Rfl:  .  levothyroxine (SYNTHROID, LEVOTHROID) 112 MCG tablet, Take 224 mcg by mouth daily before breakfast., Disp: , Rfl:  .  Multiple Vitamin (DAILY VITAMIN PO), Take 1 tablet by mouth daily., Disp: , Rfl:  .  potassium chloride SA (K-DUR,KLOR-CON) 20 MEQ tablet, Take 1 tablet (20 mEq total) by mouth daily., Disp: 3 tablet, Rfl: 0 .  ezetimibe (ZETIA) 10 MG tablet, Take 10 mg by mouth every evening. , Disp: , Rfl:  .  fluticasone (FLONASE) 50 MCG/ACT nasal spray, Place 1 spray into both nostrils daily. (Patient not taking: Reported on 10/21/2018), Disp: 16 g, Rfl: 0 .  LITHOBID 300 MG CR tablet, Take 600 mg by mouth at bedtime. , Disp: , Rfl:  .  methocarbamol (ROBAXIN) 500 MG tablet, Take 1 tablet (500 mg total) by mouth every 8 (eight) hours as needed. (Patient not taking: Reported on 10/21/2018), Disp: 15 tablet, Rfl: 0  Facility-Administered Medications Ordered in Other Visits:  .  sodium chloride flush (NS) 0.9 % injection 3 mL, 3 mL, Intravenous, Once, Belfi, Melanie, MD   Review of Systems:   ROS Negative unless otherwise specified per HPI.  Vitals:   Vitals:   10/21/18 1114  BP: 116/72  Pulse: 87  Temp: 98.7 F (37.1 C)  TempSrc: Oral  SpO2: 98%  Weight: (!) 357 lb 6.4 oz (162.1 kg)  Height: 4\' 11"  (1.499 m)     Body mass index is 72.19 kg/m.  Physical Exam:   Physical Exam Vitals signs and nursing note reviewed.  Constitutional:      General: She is not in acute distress.     Appearance: Normal appearance. She is well-developed. She is not ill-appearing or toxic-appearing.  Cardiovascular:     Rate and Rhythm: Normal rate and regular rhythm.     Pulses: Normal pulses.     Heart sounds: Normal heart sounds,  S1 normal and S2 normal.  Pulmonary:     Effort: Pulmonary effort is normal.     Breath sounds: Normal breath sounds.  Abdominal:     General: Bowel sounds are normal.  Skin:    General: Skin is warm and dry.  Neurological:     Mental Status: She is alert.     GCS: GCS eye subscore is 4. GCS verbal subscore is 5. GCS motor subscore is 6.  Psychiatric:        Speech: Speech normal.        Behavior: Behavior normal. Behavior is cooperative.    Results for orders placed or performed in visit on 10/21/18  POCT Urinalysis Dipstick  Result Value Ref Range   Color, UA Yellow    Clarity, UA Clear    Glucose, UA Negative Negative   Bilirubin, UA Negative    Ketones, UA Negative    Spec Grav, UA 1.010 1.010 - 1.025   Blood, UA Positive    pH, UA 6.5 5.0 - 8.0   Protein, UA Negative Negative   Urobilinogen, UA 0.2 0.2 or 1.0 E.U./dL   Nitrite, UA Negative    Leukocytes, UA Negative Negative   Appearance     Odor        Assessment and Plan:   Megan Moreno was seen today for back pain and urinary frequency.  Diagnoses and all orders for this visit:  Urinary frequency -     POCT Urinalysis Dipstick; Future -     Urine Culture; Future -     Urine Culture -     POCT Urinalysis Dipstick  Other orders -     Discontinue: cephALEXin (KEFLEX) 500 MG capsule; Take 1 capsule (500 mg total) by mouth 3 (three) times daily for 5 days. -     cephALEXin (KEFLEX) 500 MG capsule; Take 1 capsule (500 mg total) by mouth 3 (three) times daily for 5 days.   UA with blood. Cannot rule out stone -- which she states that she knows she has. Will empirically treat with keflex until urine culture returns. Worsening precautions advised.  **As patient was walking out of the  office, she reports to this provider that on Monday she had a syncopal event, was down for "several minutes" and hit her head. I instructed her to go to the ER for further evaluation and treatment, which she agreed to do.   . Reviewed expectations re: course of current medical issues. . Discussed self-management of symptoms. . Outlined signs and symptoms indicating need for more acute intervention. . Patient verbalized understanding and all questions were answered. . See orders for this visit as documented in the electronic medical record. . Patient received an After-Visit Summary.   Inda Coke, PA-C

## 2018-10-21 NOTE — Patient Instructions (Signed)
It was great to see you!  Please go to the ER for further evaluation and treatment.  Take care,  Inda Coke PA-C

## 2018-10-22 LAB — URINE CULTURE
MICRO NUMBER:: 630567
SPECIMEN QUALITY:: ADEQUATE

## 2018-10-25 ENCOUNTER — Telehealth: Payer: Self-pay | Admitting: Family Medicine

## 2018-10-25 NOTE — Telephone Encounter (Signed)
Copied from [place] [id number]. Topic: Quick Communication - Lab Results (Clinic Use ONLY) >> Oct 25, 2018  3:56 PM Annick, Dimaio, LPN wrote: Called patient to inform them of 10/21/2018 lab results. When patient returns call, triage nurse may disclose results. >> Oct 25, 2018  4:22 PM Valla Leaver wrote: Retrun call for labs. NO [facility] RN available. Please call back.

## 2018-10-26 NOTE — Progress Notes (Signed)
Virtual Visit via Video Note  I connected with Megan Moreno on 10/26/18 at  4:00 PM EDT by a video enabled telemedicine application and verified that I am speaking with the correct person using two identifiers.  Only able to do telephone.  Location: Patient: In home setting Provider: In office   I discussed the limitations of evaluation and management by telemedicine and the availability of in person appointments. The patient expressed understanding and agreed to proceed.  History of Present Illness: Patient over 1 week ago did have a fall.  Walking hit her foot on something caused her to trip she hit her head back of head patient states potential loss of consciousness.  Patient was seen in emergency department.  Had a CT scan of her head and neck done.  Independently visualized by me showing mild degenerative disc disease of the cervical spine, CT head was unremarkable. Patient states overall she feels like herself.  Feels like she is a little slower than usual.  Patient has had intermittent dizziness but very minimal.  Had one headache since the incident.  Patient states that nothing that stops her from any activity.   Observations/Objective: Patient's seem to have fairly decent reasoning.  Patient was able to answer questions appropriately.  Appeared alert.   Assessment and Plan: Head injury with likely no concussion.  Very intermittent symptoms.  Patient states that she is nearly back to her baseline.  We discussed if any worsening symptoms to call us otherwise can follow-up as needed   Follow Up Instructions: Follow-up as needed    I discussed the assessment and treatment plan with the patient. The patient was provided an opportunity to ask questions and all were answered. The patient agreed with the plan and demonstrated an understanding of the instructions.   The patient was advised to call back or seek an in-person evaluation if the symptoms worsen or if the condition fails to  improve as anticipated.  I provided 29 minutes of non-face-to-face time during this encounter.   Lyndal Pulley, DO

## 2018-10-26 NOTE — Telephone Encounter (Signed)
Pt given results and documented in result note 

## 2018-10-27 ENCOUNTER — Ambulatory Visit (INDEPENDENT_AMBULATORY_CARE_PROVIDER_SITE_OTHER): Payer: Medicare Other | Admitting: Family Medicine

## 2018-10-27 ENCOUNTER — Encounter: Payer: Self-pay | Admitting: Family Medicine

## 2018-10-27 ENCOUNTER — Other Ambulatory Visit: Payer: Self-pay

## 2018-10-27 DIAGNOSIS — S0990XA Unspecified injury of head, initial encounter: Secondary | ICD-10-CM | POA: Diagnosis not present

## 2018-10-27 NOTE — Assessment & Plan Note (Signed)
Head injury.  When talking the patient on the phone does not appear that patient has likely a concussion.  Patient has many other comorbidities of other things that could be contributing to patient not feeling well.  We discussed that if she felt like she was needing to be seen in the office and patient declined.  We discussed the signs and symptoms of any worsening symptoms that could be corresponding with a bad head injury and to seek medical attention immediately.  Patient then will follow-up with me again in 4 to 8 weeks.

## 2018-12-09 ENCOUNTER — Encounter: Payer: Self-pay | Admitting: Family Medicine

## 2018-12-09 ENCOUNTER — Other Ambulatory Visit: Payer: Self-pay

## 2018-12-09 ENCOUNTER — Ambulatory Visit (INDEPENDENT_AMBULATORY_CARE_PROVIDER_SITE_OTHER): Payer: Medicare Other | Admitting: Family Medicine

## 2018-12-09 VITALS — BP 138/84 | HR 86 | Temp 98.0°F | Resp 22 | Ht 60.0 in | Wt 358.8 lb

## 2018-12-09 DIAGNOSIS — E890 Postprocedural hypothyroidism: Secondary | ICD-10-CM

## 2018-12-09 DIAGNOSIS — N200 Calculus of kidney: Secondary | ICD-10-CM

## 2018-12-09 DIAGNOSIS — E119 Type 2 diabetes mellitus without complications: Secondary | ICD-10-CM

## 2018-12-09 DIAGNOSIS — Z23 Encounter for immunization: Secondary | ICD-10-CM | POA: Diagnosis not present

## 2018-12-09 DIAGNOSIS — R739 Hyperglycemia, unspecified: Secondary | ICD-10-CM

## 2018-12-09 DIAGNOSIS — E1165 Type 2 diabetes mellitus with hyperglycemia: Secondary | ICD-10-CM | POA: Insufficient documentation

## 2018-12-09 DIAGNOSIS — F319 Bipolar disorder, unspecified: Secondary | ICD-10-CM

## 2018-12-09 HISTORY — DX: Morbid (severe) obesity due to excess calories: E66.01

## 2018-12-09 HISTORY — DX: Bipolar disorder, unspecified: F31.9

## 2018-12-09 HISTORY — DX: Type 2 diabetes mellitus without complications: E11.9

## 2018-12-09 HISTORY — DX: Postprocedural hypothyroidism: E89.0

## 2018-12-09 HISTORY — DX: Calculus of kidney: N20.0

## 2018-12-09 LAB — CBC WITH DIFFERENTIAL/PLATELET
Basophils Absolute: 0 10*3/uL (ref 0.0–0.1)
Basophils Relative: 0.1 % (ref 0.0–3.0)
Eosinophils Absolute: 0.3 10*3/uL (ref 0.0–0.7)
Eosinophils Relative: 3.4 % (ref 0.0–5.0)
HCT: 44.7 % (ref 36.0–46.0)
Hemoglobin: 14.9 g/dL (ref 12.0–15.0)
Lymphocytes Relative: 18 % (ref 12.0–46.0)
Lymphs Abs: 1.5 10*3/uL (ref 0.7–4.0)
MCHC: 33.4 g/dL (ref 30.0–36.0)
MCV: 99.1 fl (ref 78.0–100.0)
Monocytes Absolute: 0.6 10*3/uL (ref 0.1–1.0)
Monocytes Relative: 7.2 % (ref 3.0–12.0)
Neutro Abs: 6.1 10*3/uL (ref 1.4–7.7)
Neutrophils Relative %: 71.3 % (ref 43.0–77.0)
Platelets: 232 10*3/uL (ref 150.0–400.0)
RBC: 4.51 Mil/uL (ref 3.87–5.11)
RDW: 14.1 % (ref 11.5–15.5)
WBC: 8.5 10*3/uL (ref 4.0–10.5)

## 2018-12-09 LAB — COMPREHENSIVE METABOLIC PANEL
ALT: 34 U/L (ref 0–35)
AST: 34 U/L (ref 0–37)
Albumin: 3.9 g/dL (ref 3.5–5.2)
Alkaline Phosphatase: 93 U/L (ref 39–117)
BUN: 14 mg/dL (ref 6–23)
CO2: 25 mEq/L (ref 19–32)
Calcium: 9.2 mg/dL (ref 8.4–10.5)
Chloride: 101 mEq/L (ref 96–112)
Creatinine, Ser: 0.94 mg/dL (ref 0.40–1.20)
GFR: 62.91 mL/min (ref >60.00)
Glucose, Bld: 142 mg/dL — ABNORMAL HIGH (ref 70–99)
Potassium: 3.9 mEq/L (ref 3.5–5.1)
Sodium: 138 mEq/L (ref 135–145)
Total Bilirubin: 0.5 mg/dL (ref 0.2–1.2)
Total Protein: 7.2 g/dL (ref 6.0–8.3)

## 2018-12-09 LAB — LIPID PANEL
Cholesterol: 288 mg/dL — ABNORMAL HIGH (ref 0–200)
HDL: 55.3 mg/dL (ref >39.00)
NonHDL: 232.29
Total CHOL/HDL Ratio: 5
Triglycerides: 210 mg/dL — ABNORMAL HIGH (ref 0.0–149.0)
VLDL: 42 mg/dL — ABNORMAL HIGH (ref 0.0–40.0)

## 2018-12-09 LAB — TSH: TSH: 56.74 u[IU]/mL — ABNORMAL HIGH (ref 0.35–4.50)

## 2018-12-09 LAB — POCT GLYCOSYLATED HEMOGLOBIN (HGB A1C): Hemoglobin A1C: 6.7 % — AB (ref 4.0–5.6)

## 2018-12-09 LAB — LDL CHOLESTEROL, DIRECT: Direct LDL: 197 mg/dL

## 2018-12-09 MED ORDER — LISINOPRIL 5 MG PO TABS: 5.0000 mg | ORAL_TABLET | Freq: Every day | ORAL | 3 refills | Status: DC

## 2018-12-09 NOTE — Progress Notes (Signed)
Subjective  CC:  Chief Complaint  Patient presents with  . Establish Care    Previous PCP was Dr. Ashby Dawes  . Adenopathy    Neck, started about 3-4 weeks ago.. Throat feels like it closes at times.. Denies difficulty swallowing and sore throat    HPI: Megan Moreno is a 48 y.o. female who presents to [facility] today to establish care with me as a new patient.   She has the following concerns or needs:  48 yo morbidly obese female with bipolar d/o and h/o thyroid cancer: I've reviewed records from prior PCP, GI, and endocrine and multiple ER visits.  Doesn't live independently. ? Mild cognitive delay with bipolar. Sees lisa poulos. Has supportive friend who has taken her in. Her parents are deceased.   By labs, has hyperglycemia. Needs evaluation.  Morbidly obese: sedentary and eats fatty sweet fried foods.   Due for labs and cpe.   Assessment  1. Controlled type 2 diabetes mellitus without complication, without long-term current use of insulin ([place])   2. Post-operative hypothyroidism   3. Nephrolithiasis   4. Morbid obesity ([place])   5. Bipolar affective disorder, remission status unspecified ([place])   6. Hyperglycemia   7. Need for immunization against influenza      Plan   Discussed recurrent diabetes. Start ace. Check labs. Will need statin. Discussed strict diet changes. Recheck 3 months. Flu shot today. Needs pneumovax as well. Needs eye exam. Start low dose ace  Recheck thyroid function and adjust meds.   Bipolar per psych. Reports stable.   Return for cpe. Needs screens.   Follow up:  No follow-ups on file. Orders Placed This Encounter  Procedures  . Flu Vaccine QUAD 36+ mos IM  . CBC with Differential/Platelet  . Comprehensive metabolic panel  . Lipid panel  . TSH  . Microalbumin / creatinine urine ratio  . POCT glycosylated hemoglobin (Hb A1C)   Meds ordered this encounter  Medications  . lisinopril (ZESTRIL) 5 MG  tablet    Sig: Take 1 tablet (5 mg total) by mouth daily.    Dispense:  90 tablet    Refill:  3     No flowsheet data found.  We updated and reviewed the patient's past history in detail and it is documented below.  Patient Active Problem List   Diagnosis Date Noted  . Post-operative hypothyroidism 12/09/2018  . Nephrolithiasis 12/09/2018  . Morbid obesity ([place]) 12/09/2018  . Bipolar disorder ([place]) 12/09/2018  . Controlled type 2 diabetes mellitus without complication, without long-term current use of insulin ([place]) 12/09/2018  . History of thyroid cancer 09/05/2016  . Dysphagia 09/04/2016  . Vulvar hyperplasia 09/22/2013   Health Maintenance  Topic Date Due  . HEMOGLOBIN A1C  Aug 30, 1968  . PNEUMOCOCCAL POLYSACCHARIDE VACCINE AGE 54-64 HIGH RISK  06/17/1970  . FOOT EXAM  06/17/1978  . OPHTHALMOLOGY EXAM  06/17/1978  . HIV Screening  06/18/1983  . TETANUS/TDAP  06/18/1987  . MAMMOGRAM  06/17/2018  . COLONOSCOPY  06/17/2018  . INFLUENZA VACCINE  11/20/2018   Immunization History  Administered Date(s) Administered  . Influenza,inj,Quad PF,6+ Mos 12/27/2016, 12/09/2018   Current Meds  Medication Sig  . Biotin 1 MG CAPS Take by mouth.  . Coenzyme Q10 10 MG capsule Take 10 mg by mouth daily.  . diphenhydrAMINE (BENADRYL) 50 MG capsule Take 50 mg by mouth every 6 (six) hours as needed.  . Flaxseed, Linseed, (FLAX SEEDS PO) Take by  mouth.  . Glucosamine-Chondroit-Vit C-Mn (GLUCOSAMINE 1500 COMPLEX PO) Take 2 tablets by mouth daily.  . Lactase (DAIRY AID PO) Take by mouth.  . levothyroxine (SYNTHROID, LEVOTHROID) 112 MCG tablet Take 112 mcg by mouth daily before breakfast.   . LITHOBID 300 MG CR tablet Take 600 mg by mouth at bedtime.   Marland Kitchen LORazepam (ATIVAN) 0.5 MG tablet Take 1 tablet by mouth 2 (two) times daily as needed.  . Multiple Vitamin (DAILY VITAMIN PO) Take 1 tablet by mouth daily.  . pantoprazole (PROTONIX) 20 MG tablet Take 1 tablet by mouth daily as needed.  .  Pumpkin Seed-Soy Germ (AZO BLADDER CONTROL/GO-LESS PO) Take by mouth.  . Rhubarb (ESTROVEN MENOPAUSE RELIEF) 4 MG TABS Take by mouth.  . vitamin E 200 UNIT capsule Take 200 Units by mouth daily.    Allergies: Patient is allergic to myrbetriq [mirabegron]; morphine and related; and omeprazole. Past Medical History Patient  has a past medical history of Anxiety, Arthritis, Bipolar disorder ([place]) (12/09/2018), Controlled type 2 diabetes mellitus without complication, without long-term current use of insulin ([place]) (12/09/2018), History of kidney stones, Hypoglycemia, Morbid obesity ([place]) (12/09/2018), Nephrolithiasis (12/09/2018), Obesity, Post-operative hypothyroidism (12/09/2018), and Thyroid cancer ([place]). Past Surgical History Patient  has a past surgical history that includes rotator cuff surgery (Bilateral); Heel spur surgery (Left); Lithotripsy; Partial hysterectomy; Thyroidectomy; Tonsillectomy and adenoidectomy; and Esophagogastroduodenoscopy (egd) with propofol (N/A, 12/23/2016). Family History: Patient family history includes Ovarian cancer in her mother. Social History:  Patient  reports that she has never smoked. She has never used smokeless tobacco. She reports that she does not drink alcohol or use drugs.  Review of Systems: Constitutional: negative for fever or malaise Ophthalmic: negative for photophobia, double vision or loss of vision Cardiovascular: negative for chest pain, dyspnea on exertion, or new LE swelling Respiratory: negative for SOB or persistent cough Gastrointestinal: negative for abdominal pain, change in bowel habits or melena Genitourinary: negative for dysuria or gross hematuria Musculoskeletal: negative for new gait disturbance or muscular weakness Integumentary: negative for new or persistent rashes Neurological: negative for TIA or stroke symptoms Psychiatric: negative for SI or delusions Allergic/Immunologic: negative for hives  Patient Care Team     Relationship Specialty Notifications Start End  Leamon Arnt, MD PCP - General Family Medicine  12/09/18   Noemi Chapel, NP Nurse Practitioner   12/09/18     Objective  Vitals: BP 138/84   Pulse 86   Temp 98 F (36.7 C) (Tympanic)   Resp (!) 22   Ht 5' (1.524 m)   Wt (!) 358 lb 12.8 oz (162.8 kg)   LMP  (LMP Unknown)   SpO2 96%   BMI 70.07 kg/m  General:  Well developed, well nourished, no acute distress  Psych:  Alert and oriented,normal mood and affect HEENT:  Normocephalic, atraumatic, non-icteric sclera, PERRL, oropharynx is without mass or exudate, supple neck without adenopathy, mass or thyromegaly Cardiovascular:  RRR without gallop, rub or murmur, nondisplaced PMI Respiratory:  Good breath sounds bilaterally, CTAB with normal respiratory effort Gastrointestinal: normal bowel sounds, soft, non-tender, no noted masses. No HSM MSK: no deformities, contusions. Joints are without erythema or swelling Skin:  Warm, no rashes or suspicious lesions noted Neurologic:    Mental status is normal. Gross motor and sensory exams are normal. Normal gait Lab Results  Component Value Date   HGBA1C 6.7 (A) 12/09/2018      Commons side effects, risks, benefits, and alternatives for medications and treatment plan prescribed today were  discussed, and the patient expressed understanding of the given instructions. Patient is instructed to call or message via MyChart if he/she has any questions or concerns regarding our treatment plan. No barriers to understanding were identified. We discussed Red Flag symptoms and signs in detail. Patient expressed understanding regarding what to do in case of urgent or emergency type symptoms.   Medication list was reconciled, printed and provided to the patient in AVS. Patient instructions and summary information was reviewed with the patient as documented in the AVS. This note was prepared with assistance of Dragon voice recognition software. Occasional  wrong-word or sound-a-like substitutions may have occurred due to the inherent limitations of voice recognition software

## 2018-12-09 NOTE — Patient Instructions (Addendum)
Please return in 3 months for your annual complete physical; please come fasting. Recheck your diabetes.   You now have diabetes again. Work on stopping all sweet drinks. Try to walk everyday. And work on losing weight.  It was a pleasure meeting you today! Thank you for choosing [place] to meet your healthcare needs! I truly look forward to working with you. If you have any questions or concerns, please send me a message via Mychart or call the office at [phone].   Fat and Cholesterol Restricted Eating Plan Getting too much fat and cholesterol in your diet may cause health problems. Choosing the right foods helps keep your fat and cholesterol at normal levels. This can keep you from getting certain diseases. Your doctor may recommend an eating plan that includes:  Total fat: ______% or less of total calories a day.  Saturated fat: ______% or less of total calories a day.  Cholesterol: less than _________mg a day.  Fiber: ______g a day. What are tips for following this plan? Meal planning  At meals, divide your plate into four equal parts: ? Fill one-half of your plate with vegetables and green salads. ? Fill one-fourth of your plate with whole grains. ? Fill one-fourth of your plate with low-fat (lean) protein foods.  Eat fish that is high in omega-3 fats at least two times a week. This includes mackerel, tuna, sardines, and salmon.  Eat foods that are high in fiber, such as whole grains, beans, apples, broccoli, carrots, peas, and barley. General tips   Work with your doctor to lose weight if you need to.  Avoid: ? Foods with added sugar. ? Fried foods. ? Foods with partially hydrogenated oils.  Limit alcohol intake to no more than 1 drink a day for nonpregnant women and 2 drinks a day for men. One drink equals 12 oz of beer, 5 oz of wine, or 1 oz of hard liquor. Reading food labels  Check food labels for: ? Trans fats. ? Partially hydrogenated oils. ? Saturated fat (g)  in each serving. ? Cholesterol (mg) in each serving. ? Fiber (g) in each serving.  Choose foods with healthy fats, such as: ? Monounsaturated fats. ? Polyunsaturated fats. ? Omega-3 fats.  Choose grain products that have whole grains. Look for the word "whole" as the first word in the ingredient list. Cooking  Cook foods using low-fat methods. These include baking, boiling, grilling, and broiling.  Eat more home-cooked foods. Eat at restaurants and buffets less often.  Avoid cooking using saturated fats, such as butter, cream, palm oil, palm kernel oil, and coconut oil. Recommended foods  Fruits  All fresh, canned (in natural juice), or frozen fruits. Vegetables  Fresh or frozen vegetables (raw, steamed, roasted, or grilled). Green salads. Grains  Whole grains, such as whole wheat or whole grain breads, crackers, cereals, and pasta. Unsweetened oatmeal, bulgur, barley, quinoa, or brown rice. Corn or whole wheat flour tortillas. Meats and other protein foods  Ground beef (85% or leaner), grass-fed beef, or beef trimmed of fat. Skinless chicken or [place]. Ground chicken or [place]. Pork trimmed of fat. All fish and seafood. Egg whites. Dried beans, peas, or lentils. Unsalted nuts or seeds. Unsalted canned beans. Nut butters without added sugar or oil. Dairy  Low-fat or nonfat dairy products, such as skim or 1% milk, 2% or reduced-fat cheeses, low-fat and fat-free ricotta or cottage cheese, or plain low-fat and nonfat yogurt. Fats and oils  Tub margarine without trans fats. Light or  reduced-fat mayonnaise and salad dressings. Avocado. Olive, canola, sesame, or safflower oils. The items listed above may not be a complete list of foods and beverages you can eat. Contact a dietitian for more information. Foods to avoid Fruits  Canned fruit in heavy syrup. Fruit in cream or butter sauce. Fried fruit. Vegetables  Vegetables cooked in cheese, cream, or butter sauce. Fried  vegetables. Grains  White bread. White pasta. White rice. Cornbread. Bagels, pastries, and croissants. Crackers and snack foods that contain trans fat and hydrogenated oils. Meats and other protein foods  Fatty cuts of meat. Ribs, chicken wings, bacon, sausage, bologna, salami, chitterlings, fatback, hot dogs, bratwurst, and packaged lunch meats. Liver and organ meats. Whole eggs and egg yolks. Chicken and [place] with skin. Fried meat. Dairy  Whole or 2% milk, cream, half-and-half, and cream cheese. Whole milk cheeses. Whole-fat or sweetened yogurt. Full-fat cheeses. Nondairy creamers and whipped toppings. Processed cheese, cheese spreads, and cheese curds. Beverages  Alcohol. Sugar-sweetened drinks such as sodas, lemonade, and fruit drinks. Fats and oils  Butter, stick margarine, lard, shortening, ghee, or bacon fat. Coconut, palm kernel, and palm oils. Sweets and desserts  Corn syrup, sugars, honey, and molasses. Candy. Jam and jelly. Syrup. Sweetened cereals. Cookies, pies, cakes, donuts, muffins, and ice cream. The items listed above may not be a complete list of foods and beverages you should avoid. Contact a dietitian for more information. Summary  Choosing the right foods helps keep your fat and cholesterol at normal levels. This can keep you from getting certain diseases.  At meals, fill one-half of your plate with vegetables and green salads.  Eat high-fiber foods, like whole grains, beans, apples, carrots, peas, and barley.  Limit added sugar, saturated fats, alcohol, and fried foods. This information is not intended to replace advice given to you by your health care provider. Make sure you discuss any questions you have with your health care provider. Document Released: 10/07/2011 Document Revised: 12/09/2017 Document Reviewed: 12/23/2016 Elsevier Patient Education  [place].   Diabetes Mellitus and Nutrition, Adult When you have diabetes (diabetes mellitus),  it is very important to have healthy eating habits because your blood sugar (glucose) levels are greatly affected by what you eat and drink. Eating healthy foods in the appropriate amounts, at about the same times every day, can help you:  Control your blood glucose.  Lower your risk of heart disease.  Improve your blood pressure.  Reach or maintain a healthy weight. Every person with diabetes is different, and each person has different needs for a meal plan. Your health care provider may recommend that you work with a diet and nutrition specialist (dietitian) to make a meal plan that is best for you. Your meal plan may vary depending on factors such as:  The calories you need.  The medicines you take.  Your weight.  Your blood glucose, blood pressure, and cholesterol levels.  Your activity level.  Other health conditions you have, such as heart or kidney disease. How do carbohydrates affect me? Carbohydrates, also called carbs, affect your blood glucose level more than any other type of food. Eating carbs naturally raises the amount of glucose in your blood. Carb counting is a method for keeping track of how many carbs you eat. Counting carbs is important to keep your blood glucose at a healthy level, especially if you use insulin or take certain oral diabetes medicines. It is important to know how many carbs you can safely have in each  meal. This is different for every person. Your dietitian can help you calculate how many carbs you should have at each meal and for each snack. Foods that contain carbs include:  Bread, cereal, rice, pasta, and crackers.  Potatoes and corn.  Peas, beans, and lentils.  Milk and yogurt.  Fruit and juice.  Desserts, such as cakes, cookies, ice cream, and candy. How does alcohol affect me? Alcohol can cause a sudden decrease in blood glucose (hypoglycemia), especially if you use insulin or take certain oral diabetes medicines. Hypoglycemia can be a  life-threatening condition. Symptoms of hypoglycemia (sleepiness, dizziness, and confusion) are similar to symptoms of having too much alcohol. If your health care provider says that alcohol is safe for you, follow these guidelines:  Limit alcohol intake to no more than 1 drink per day for nonpregnant women and 2 drinks per day for men. One drink equals 12 oz of beer, 5 oz of wine, or 1 oz of hard liquor.  Do not drink on an empty stomach.  Keep yourself hydrated with water, diet soda, or unsweetened iced tea.  Keep in mind that regular soda, juice, and other mixers may contain a lot of sugar and must be counted as carbs. What are tips for following this plan?  Reading food labels  Start by checking the serving size on the "Nutrition Facts" label of packaged foods and drinks. The amount of calories, carbs, fats, and other nutrients listed on the label is based on one serving of the item. Many items contain more than one serving per package.  Check the total grams (g) of carbs in one serving. You can calculate the number of servings of carbs in one serving by dividing the total carbs by 15. For example, if a food has 30 g of total carbs, it would be equal to 2 servings of carbs.  Check the number of grams (g) of saturated and trans fats in one serving. Choose foods that have low or no amount of these fats.  Check the number of milligrams (mg) of salt (sodium) in one serving. Most people should limit total sodium intake to less than 2,300 mg per day.  Always check the nutrition information of foods labeled as "low-fat" or "nonfat". These foods may be higher in added sugar or refined carbs and should be avoided.  Talk to your dietitian to identify your daily goals for nutrients listed on the label. Shopping  Avoid buying canned, premade, or processed foods. These foods tend to be high in fat, sodium, and added sugar.  Shop around the outside edge of the grocery store. This includes fresh  fruits and vegetables, bulk grains, fresh meats, and fresh dairy. Cooking  Use low-heat cooking methods, such as baking, instead of high-heat cooking methods like deep frying.  Cook using healthy oils, such as olive, canola, or sunflower oil.  Avoid cooking with butter, cream, or high-fat meats. Meal planning  Eat meals and snacks regularly, preferably at the same times every day. Avoid going long periods of time without eating.  Eat foods high in fiber, such as fresh fruits, vegetables, beans, and whole grains. Talk to your dietitian about how many servings of carbs you can eat at each meal.  Eat 4-6 ounces (oz) of lean protein each day, such as lean meat, chicken, fish, eggs, or tofu. One oz of lean protein is equal to: ? 1 oz of meat, chicken, or fish. ? 1 egg. ?  cup of tofu.  Eat some foods  each day that contain healthy fats, such as avocado, nuts, seeds, and fish. Lifestyle  Check your blood glucose regularly.  Exercise regularly as told by your health care provider. This may include: ? 150 minutes of moderate-intensity or vigorous-intensity exercise each week. This could be brisk walking, biking, or water aerobics. ? Stretching and doing strength exercises, such as yoga or weightlifting, at least 2 times a week.  Take medicines as told by your health care provider.  Do not use any products that contain nicotine or tobacco, such as cigarettes and e-cigarettes. If you need help quitting, ask your health care provider.  Work with a Social worker or diabetes educator to identify strategies to manage stress and any emotional and social challenges. Questions to ask a health care provider  Do I need to meet with a diabetes educator?  Do I need to meet with a dietitian?  What number can I call if I have questions?  When are the best times to check my blood glucose? Where to find more information:  American Diabetes Association: diabetes.org  Academy of Nutrition and  Dietetics: www.eatright.[employer] of Diabetes and Digestive and Kidney Diseases (NIH): [employer] Summary  A healthy meal plan will help you control your blood glucose and maintain a healthy lifestyle.  Working with a diet and nutrition specialist (dietitian) can help you make a meal plan that is best for you.  Keep in mind that carbohydrates (carbs) and alcohol have immediate effects on your blood glucose levels. It is important to count carbs and to use alcohol carefully. This information is not intended to replace advice given to you by your health care provider. Make sure you discuss any questions you have with your health care provider. Document Released: 01/02/2005 Document Revised: 03/20/2017 Document Reviewed: 05/12/2016 Elsevier Patient Education  2020 [employer].

## 2018-12-10 LAB — MICROALBUMIN / CREATININE URINE RATIO
Creatinine,U: 98 mg/dL
Microalb Creat Ratio: 9.9 mg/g (ref 0.0–30.0)
Microalb, Ur: 9.7 mg/dL — ABNORMAL HIGH (ref 0.0–1.9)

## 2018-12-13 MED ORDER — ATORVASTATIN CALCIUM 20 MG PO TABS: 20.0000 mg | ORAL_TABLET | Freq: Every day | ORAL | 3 refills | Status: DC

## 2018-12-13 NOTE — Addendum Note (Signed)
Addended by: Billey Chang on: 12/13/2018 12:43 PM   Modules accepted: Orders

## 2018-12-13 NOTE — Progress Notes (Signed)
Please call patient: I have reviewed his/her lab results. Labs show that hypothyroidism is very uncontrolled. Is she taking her thyroid medication daily; please verify and verify dose.  If she is on medication daily without missing doses, then I will adjust up the dose.  Cholesterol is very very high as well. I have ordered a statin to start nightly.  Please f/u in the office with office visit in 6-8 weeks to recheck thyroid.

## 2018-12-21 ENCOUNTER — Encounter: Payer: Self-pay | Admitting: *Deleted

## 2018-12-24 ENCOUNTER — Telehealth: Payer: Self-pay | Admitting: Family Medicine

## 2018-12-24 MED ORDER — ATORVASTATIN CALCIUM 20 MG PO TABS: 20.0000 mg | ORAL_TABLET | Freq: Every day | ORAL | 3 refills | Status: DC

## 2018-12-24 NOTE — Telephone Encounter (Signed)
Patient called after receiving letter from office to call about her lab result 12/13/2018. Patient was scheduled follow up.  She states that she is not taking her synthroid as her Dr Jacelyn Pi had requested. Rx states take 1 tab daily of 18mcg. Patient states that Dr Jacelyn Pi had asked her to increase her dose to 2 tabs daily. Patient states the dose made her sick. She has only taken 1 tab a day. I was unable to find documentation of this.  She states that she will take the statin but has not picked it up. She requested it be sent to [facility]. I reordered and sent rx to [facility]. Patient is aware and will start the statin.

## 2018-12-24 NOTE — Telephone Encounter (Signed)
Mr. Vanover only taking Levothyroxine 112 mcg.

## 2018-12-28 MED ORDER — LEVOTHYROXINE SODIUM 150 MCG PO TABS: 150.0000 ug | ORAL_TABLET | Freq: Every day | ORAL | 0 refills | Status: DC

## 2018-12-28 MED ORDER — LEVOTHYROXINE SODIUM 175 MCG PO TABS: 175.0000 ug | ORAL_TABLET | Freq: Every day | ORAL | 2 refills | Status: DC

## 2018-12-28 NOTE — Telephone Encounter (Signed)
Please call patient: we need to increase her thryoid medications. We will increase slowly so she will be able to tolerate it.   I have ordered 172mcg tabs; please start this dose daily for two weeks, then I will increase to 110mcg daily. Then recheck in office visit already scheduled for 10/1.   Lab Results  Component Value Date   TSH 56.74 (H) 12/09/2018

## 2018-12-28 NOTE — Telephone Encounter (Signed)
See note

## 2018-12-28 NOTE — Telephone Encounter (Signed)
LMOVM for pt/Megan Moreno to return call. Ok for [facility] to Discuss results / PCP recommendations.

## 2018-12-28 NOTE — Addendum Note (Signed)
Addended by: Billey Chang on: 12/28/2018 11:44 AM   Modules accepted: Orders

## 2018-12-28 NOTE — Telephone Encounter (Signed)
Patient requesting a call back from CMA to discuss the medication Atorvastatin Calcium. She states she is unable to take this medication as it makes her legs feel "weak". She would like some recommendations/advice.

## 2018-12-28 NOTE — Telephone Encounter (Signed)
Called pt back, she reports that since taking first dose on 12/25/18. She reports leg cramps and extremity weakness.. She states that since stopping medication her sxs improved. Wants to know if there is anything else she can do?

## 2018-12-29 MED ORDER — LEVOTHYROXINE SODIUM 150 MCG PO TABS: 150.0000 ug | ORAL_TABLET | Freq: Every day | ORAL | 0 refills | Status: DC

## 2018-12-29 MED ORDER — LEVOTHYROXINE SODIUM 175 MCG PO TABS: 175.0000 ug | ORAL_TABLET | Freq: Every day | ORAL | 0 refills | Status: DC

## 2018-12-29 NOTE — Telephone Encounter (Signed)
Ok to hold lipitor for now until we get her thyroid corrected.  Please see notes below: did you discuss this with her; need to slowly increase her thyroid meds.

## 2018-12-29 NOTE — Telephone Encounter (Signed)
Note from Dr. Jonni Sanger read to patient regarding Lipitor and thyroid meds.  Did not see synthroid 154mcg had been called in to pharmacy? Pt is currently taking 151mcg, took dose today.  States she needs name brands for insurance coverage.  CB# [phone] [id number]

## 2018-12-29 NOTE — Addendum Note (Signed)
Addended by: Layla Barter on: 12/29/2018 03:18 PM   Modules accepted: Orders

## 2018-12-29 NOTE — Telephone Encounter (Signed)
LMOVM for pt to return call. Ok for [facility] to Discuss results / PCP recommendations.

## 2018-12-29 NOTE — Telephone Encounter (Signed)
Meds have been sent for brand name, pt aware

## 2019-01-13 ENCOUNTER — Telehealth: Payer: Self-pay | Admitting: Family Medicine

## 2019-01-13 NOTE — Telephone Encounter (Signed)
AWV has been confirmed for 01/20/19

## 2019-01-13 NOTE — Telephone Encounter (Signed)
I left a message asking the patient to call me at [phone] to schedule AWV with Courtney on 10/1.  I’m waiting for a call back to either confirm or decline the appointment. VDM (Dee-Dee) °

## 2019-01-13 NOTE — Telephone Encounter (Signed)
Patient called in and confirmed appointment.

## 2019-01-20 ENCOUNTER — Ambulatory Visit: Payer: Medicare Other

## 2019-01-20 ENCOUNTER — Ambulatory Visit: Payer: Medicare Other | Admitting: Family Medicine

## 2019-01-21 ENCOUNTER — Other Ambulatory Visit: Payer: Self-pay

## 2019-01-21 ENCOUNTER — Ambulatory Visit (INDEPENDENT_AMBULATORY_CARE_PROVIDER_SITE_OTHER): Payer: Medicare Other | Admitting: Family Medicine

## 2019-01-21 ENCOUNTER — Encounter: Payer: Self-pay | Admitting: Family Medicine

## 2019-01-21 DIAGNOSIS — R197 Diarrhea, unspecified: Secondary | ICD-10-CM

## 2019-01-21 DIAGNOSIS — E782 Mixed hyperlipidemia: Secondary | ICD-10-CM

## 2019-01-21 DIAGNOSIS — E119 Type 2 diabetes mellitus without complications: Secondary | ICD-10-CM | POA: Diagnosis not present

## 2019-01-21 DIAGNOSIS — R4189 Other symptoms and signs involving cognitive functions and awareness: Secondary | ICD-10-CM | POA: Diagnosis not present

## 2019-01-21 DIAGNOSIS — E1169 Type 2 diabetes mellitus with other specified complication: Secondary | ICD-10-CM | POA: Insufficient documentation

## 2019-01-21 DIAGNOSIS — E890 Postprocedural hypothyroidism: Secondary | ICD-10-CM

## 2019-01-21 DIAGNOSIS — Z91410 Personal history of adult physical and sexual abuse: Secondary | ICD-10-CM | POA: Insufficient documentation

## 2019-01-21 MED ORDER — ATORVASTATIN CALCIUM 20 MG PO TABS: 20.0000 mg | ORAL_TABLET | Freq: Every day | ORAL | 3 refills | Status: DC

## 2019-01-21 MED ORDER — LISINOPRIL 5 MG PO TABS: 5.0000 mg | ORAL_TABLET | Freq: Every day | ORAL | 3 refills | Status: DC

## 2019-01-21 MED ORDER — LEVOTHYROXINE SODIUM 175 MCG PO TABS: 175.0000 ug | ORAL_TABLET | Freq: Every day | ORAL | Status: DC
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
## 2019-01-27 ENCOUNTER — Ambulatory Visit: Payer: Medicare Other

## 2019-01-27 ENCOUNTER — Other Ambulatory Visit: Payer: Self-pay

## 2019-01-27 ENCOUNTER — Encounter: Payer: Self-pay | Admitting: Family Medicine

## 2019-01-27 ENCOUNTER — Ambulatory Visit (INDEPENDENT_AMBULATORY_CARE_PROVIDER_SITE_OTHER): Payer: Medicare Other | Admitting: Family Medicine

## 2019-01-27 VITALS — BP 134/86 | HR 86 | Temp 97.7°F | Resp 18 | Ht 60.0 in | Wt 346.2 lb

## 2019-01-27 DIAGNOSIS — E119 Type 2 diabetes mellitus without complications: Secondary | ICD-10-CM | POA: Diagnosis not present

## 2019-01-27 DIAGNOSIS — E890 Postprocedural hypothyroidism: Secondary | ICD-10-CM

## 2019-01-27 DIAGNOSIS — Z1211 Encounter for screening for malignant neoplasm of colon: Secondary | ICD-10-CM

## 2019-01-27 DIAGNOSIS — R197 Diarrhea, unspecified: Secondary | ICD-10-CM

## 2019-01-27 DIAGNOSIS — E782 Mixed hyperlipidemia: Secondary | ICD-10-CM

## 2019-01-27 DIAGNOSIS — E1169 Type 2 diabetes mellitus with other specified complication: Secondary | ICD-10-CM | POA: Diagnosis not present

## 2019-01-27 DIAGNOSIS — Z1231 Encounter for screening mammogram for malignant neoplasm of breast: Secondary | ICD-10-CM

## 2019-01-27 DIAGNOSIS — R8290 Unspecified abnormal findings in urine: Secondary | ICD-10-CM

## 2019-01-27 DIAGNOSIS — Z1212 Encounter for screening for malignant neoplasm of rectum: Secondary | ICD-10-CM

## 2019-01-27 LAB — POCT URINALYSIS DIPSTICK
Bilirubin, UA: NEGATIVE
Blood, UA: POSITIVE
Glucose, UA: NEGATIVE
Ketones, UA: NEGATIVE
Leukocytes, UA: NEGATIVE
Nitrite, UA: NEGATIVE
Protein, UA: NEGATIVE
Spec Grav, UA: 1.01 (ref 1.010–1.025)
Urobilinogen, UA: 0.2 E.U./dL
pH, UA: 6.5 (ref 5.0–8.0)

## 2019-01-27 MED ORDER — ONETOUCH ULTRASOFT LANCETS MISC: 12 refills | Status: DC

## 2019-01-27 MED ORDER — GLUCOSE BLOOD VI STRP: ORAL_STRIP | 12 refills | Status: AC

## 2019-01-27 MED ORDER — ONETOUCH ULTRA 2 W/DEVICE KIT: PACK | 0 refills | Status: DC
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
## 2019-01-27 NOTE — Patient Instructions (Signed)
Please return in 3 months for diabetes follow up  We will call you with your thyroid and urine test results. I will tell you what dose of thyroid medication to take at that time and I will order it if it needs to be changed.   Please take your thyroid medication as follows: Marland Kitchen Take every morning on an empty stomach . Do not take food or drink for at least 30 minutes . Do not take with PPI's (prilosec, protonix etc), Calcium, Iron or multivitamins; these need to be taken at least 4 hours apart from the thyroid medication.   Keep working on your diet! Good job! Please bring in all of your medication bottles to your next visit.   We will call you with information regarding your referral appointment. [facility] for diarrhea and colonoscopy. [facility] for mammogram.  If you do not hear from [place] within the next 2 weeks, please let me know. It can take 1-2 weeks to get appointments set up with the specialists.   If you have any questions or concerns, please don't hesitate to send me a message via MyChart or call the office at [phone]. Thank you for visiting with [place] today! It's our pleasure caring for you.

## 2019-01-28 LAB — URINE CULTURE
MICRO NUMBER:: 969402
SPECIMEN QUALITY:: ADEQUATE

## 2019-01-28 LAB — URINALYSIS, MICROSCOPIC ONLY

## 2019-01-28 LAB — TSH: TSH: 15.38 u[IU]/mL — ABNORMAL HIGH (ref 0.35–4.50)

## 2019-01-31 MED ORDER — LEVOTHYROXINE SODIUM 200 MCG PO TABS: 200.0000 ug | ORAL_TABLET | Freq: Every day | ORAL | 0 refills | Status: DC

## 2019-01-31 NOTE — Progress Notes (Signed)
Please call patient: I have reviewed his/her lab results. Urine is normal.  Thyroid is much better! Still need to adjust up her thyroid medication a bit more thought. I have ordered the new dose: 275mcg to be taken daily. Please stop the 159mcg dose and increase to 271mcg. We will recheck your level at this dose at your next visit in 3 months. Please ensure she is scheduled for diabetes and thyroid f/u. Thanks!

## 2019-01-31 NOTE — Addendum Note (Signed)
Addended by: Billey Chang on: 01/31/2019 09:39 AM   Modules accepted: Orders

## 2019-02-01 ENCOUNTER — Other Ambulatory Visit: Payer: Self-pay

## 2019-02-01 ENCOUNTER — Ambulatory Visit (INDEPENDENT_AMBULATORY_CARE_PROVIDER_SITE_OTHER): Payer: Medicare Other

## 2019-02-01 DIAGNOSIS — Z0000 Encounter for general adult medical examination without abnormal findings: Secondary | ICD-10-CM | POA: Diagnosis not present

## 2019-02-01 NOTE — Patient Instructions (Signed)
Megan Moreno , Thank you for taking time to come for your Medicare Wellness Visit. I appreciate your ongoing commitment to your health goals. Please review the following plan we discussed and let me know if I can assist you in the future.   Screening recommendations/referrals: Colorectal Screening: referral to GI; you will be contacted with appointment Mammogram: ordered; you will be contacted with appointment  Bone Density: starting at age 50  Vision and Dental Exams: Recommended annual ophthalmology exams for early detection of glaucoma and other disorders of the eye Recommended annual dental exams for proper oral hygiene  Diabetic Exams: Diabetic Eye Exam: recommended yearly  Diabetic Foot Exam: up to date; at last office visit   Vaccinations: Influenza vaccine: completed 12/09/18 Pneumococcal vaccine: up to date; last 12/08/13; next at age 63 Tdap vaccine: up to date;last 05/31/09 (next due after 05/2019) Shingles vaccine: Please call your insurance company to determine your out of pocket expense for the Shingrix vaccine. You may receive this vaccine at your local pharmacy.  Advanced directives: Advance directives discussed with you today. I have provided a copy for you to complete at home and have notarized. Once this is complete please bring a copy in to our office so we can scan it into your chart.  Goals: Recommend to remove any items from the home that may cause slips or trips.  Next appointment: Please schedule your Annual Wellness Visit with your Nurse Health Advisor in one year.  I will check with Dr. Jonni Sanger to see what she advises with seeing a doctor as a consult to remove excess skin. I will be in touch with her response.   Preventive Care 40-64 Years, Female Preventive care refers to lifestyle choices and visits with your health care provider that can promote health and wellness. What does preventive care include?  A yearly physical exam. This is also called an annual well  check.  Dental exams once or twice a year.  Routine eye exams. Ask your health care provider how often you should have your eyes checked.  Personal lifestyle choices, including:  Daily care of your teeth and gums.  Regular physical activity.  Eating a healthy diet.  Avoiding tobacco and drug use.  Limiting alcohol use.  Practicing safe sex.  Taking low-dose aspirin daily starting at age 29 if recommended by your health care provider.  Taking vitamin and mineral supplements as recommended by your health care provider. What happens during an annual well check? The services and screenings done by your health care provider during your annual well check will depend on your age, overall health, lifestyle risk factors, and family history of disease. Counseling  Your health care provider may ask you questions about your:  Alcohol use.  Tobacco use.  Drug use.  Emotional well-being.  Home and relationship well-being.  Sexual activity.  Eating habits.  Work and work Statistician.  Method of birth control.  Menstrual cycle.  Pregnancy history. Screening  You may have the following tests or measurements:  Height, weight, and BMI.  Blood pressure.  Lipid and cholesterol levels. These may be checked every 5 years, or more frequently if you are over 67 years old.  Skin check.  Lung cancer screening. You may have this screening every year starting at age 17 if you have a 30-pack-year history of smoking and currently smoke or have quit within the past 15 years.  Fecal occult blood test (FOBT) of the stool. You may have this test every year starting at  age 13.  Flexible sigmoidoscopy or colonoscopy. You may have a sigmoidoscopy every 5 years or a colonoscopy every 10 years starting at age 59.  Hepatitis C blood test.  Hepatitis B blood test.  Sexually transmitted disease (STD) testing.  Diabetes screening. This is done by checking your blood sugar (glucose) after  you have not eaten for a while (fasting). You may have this done every 1-3 years.  Mammogram. This may be done every 1-2 years. Talk to your health care provider about when you should start having regular mammograms. This may depend on whether you have a family history of breast cancer.  BRCA-related cancer screening. This may be done if you have a family history of breast, ovarian, tubal, or peritoneal cancers.  Pelvic exam and Pap test. This may be done every 3 years starting at age 70. Starting at age 63, this may be done every 5 years if you have a Pap test in combination with an HPV test.  Bone density scan. This is done to screen for osteoporosis. You may have this scan if you are at high risk for osteoporosis. Discuss your test results, treatment options, and if necessary, the need for more tests with your health care provider. Vaccines  Your health care provider may recommend certain vaccines, such as:  Influenza vaccine. This is recommended every year.  Tetanus, diphtheria, and acellular pertussis (Tdap, Td) vaccine. You may need a Td booster every 10 years.  Zoster vaccine. You may need this at age 65.  Pneumococcal 13-valent conjugate (PCV13) vaccine. You may need this if you have certain conditions and were not previously vaccinated.  Pneumococcal polysaccharide (PPSV23) vaccine. You may need one or two doses if you smoke cigarettes or if you have certain conditions. Talk to your health care provider about which screenings and vaccines you need and how often you need them. This information is not intended to replace advice given to you by your health care provider. Make sure you discuss any questions you have with your health care provider. Document Released: 05/04/2015 Document Revised: 12/26/2015 Document Reviewed: 02/06/2015 Elsevier Interactive Patient Education  2017 [place] Prevention in the Home Falls can cause injuries. They can happen to people of all  ages. There are many things you can do to make your home safe and to help prevent falls. What can I do on the outside of my home?  Regularly fix the edges of walkways and driveways and fix any cracks.  Remove anything that might make you trip as you walk through a door, such as a raised step or threshold.  Trim any bushes or trees on the path to your home.  Use bright outdoor lighting.  Clear any walking paths of anything that might make someone trip, such as rocks or tools.  Regularly check to see if handrails are loose or broken. Make sure that both sides of any steps have handrails.  Any raised decks and porches should have guardrails on the edges.  Have any leaves, snow, or ice cleared regularly.  Use sand or salt on walking paths during winter.  Clean up any spills in your garage right away. This includes oil or grease spills. What can I do in the bathroom?  Use night lights.  Install grab bars by the toilet and in the tub and shower. Do not use towel bars as grab bars.  Use non-skid mats or decals in the tub or shower.  If you need to sit down  in the shower, use a plastic, non-slip stool.  Keep the floor dry. Clean up any water that spills on the floor as soon as it happens.  Remove soap buildup in the tub or shower regularly.  Attach bath mats securely with double-sided non-slip rug tape.  Do not have throw rugs and other things on the floor that can make you trip. What can I do in the bedroom?  Use night lights.  Make sure that you have a light by your bed that is easy to reach.  Do not use any sheets or blankets that are too big for your bed. They should not hang down onto the floor.  Have a firm chair that has side arms. You can use this for support while you get dressed.  Do not have throw rugs and other things on the floor that can make you trip. What can I do in the kitchen?  Clean up any spills right away.  Avoid walking on wet floors.  Keep items  that you use a lot in easy-to-reach places.  If you need to reach something above you, use a strong step stool that has a grab bar.  Keep electrical cords out of the way.  Do not use floor polish or wax that makes floors slippery. If you must use wax, use non-skid floor wax.  Do not have throw rugs and other things on the floor that can make you trip. What can I do with my stairs?  Do not leave any items on the stairs.  Make sure that there are handrails on both sides of the stairs and use them. Fix handrails that are broken or loose. Make sure that handrails are as long as the stairways.  Check any carpeting to make sure that it is firmly attached to the stairs. Fix any carpet that is loose or worn.  Avoid having throw rugs at the top or bottom of the stairs. If you do have throw rugs, attach them to the floor with carpet tape.  Make sure that you have a light switch at the top of the stairs and the bottom of the stairs. If you do not have them, ask someone to add them for you. What else can I do to help prevent falls?  Wear shoes that:  Do not have high heels.  Have rubber bottoms.  Are comfortable and fit you well.  Are closed at the toe. Do not wear sandals.  If you use a stepladder:  Make sure that it is fully opened. Do not climb a closed stepladder.  Make sure that both sides of the stepladder are locked into place.  Ask someone to hold it for you, if possible.  Clearly mark and make sure that you can see:  Any grab bars or handrails.  First and last steps.  Where the edge of each step is.  Use tools that help you move around (mobility aids) if they are needed. These include:  Canes.  Walkers.  Scooters.  Crutches.  Turn on the lights when you go into a dark area. Replace any light bulbs as soon as they burn out.  Set up your furniture so you have a clear path. Avoid moving your furniture around.  If any of your floors are uneven, fix them.  If  there are any pets around you, be aware of where they are.  Review your medicines with your doctor. Some medicines can make you feel dizzy. This can increase your chance of falling.  Ask your doctor what other things that you can do to help prevent falls. This information is not intended to replace advice given to you by your health care provider. Make sure you discuss any questions you have with your health care provider. Document Released: 02/01/2009 Document Revised: 09/13/2015 Document Reviewed: 05/12/2014 Elsevier Interactive Patient Education  2017 [employer].

## 2019-02-01 NOTE — Progress Notes (Signed)
This visit is being conducted via phone call due to the COVID-19 pandemic. This patient has given me verbal consent via phone to conduct this visit, patient states they are participating from their home address. Some vital signs may be absent or patient reported.   Patient identification: identified by name, DOB, and current address.   Subjective:   Megan Moreno is a 48 y.o. female who presents for Medicare Annual (Subsequent) preventive examination.  Review of Systems:   Cardiac Risk Factors include: dyslipidemia;diabetes mellitus;hypertension;sedentary lifestyle;obesity (BMI >30kg/m2)     Objective:     Vitals: LMP  (LMP Unknown)   There is no height or weight on file to calculate BMI.  Advanced Directives 02/01/2019 10/21/2018 06/18/2018 02/06/2018 03/16/2017 12/23/2016 12/10/2016  Does Patient Have a Medical Advance Directive? No No No No No No No  Copy of Healthcare Power of Attorney in Chart? - - - - - No - copy requested No - copy requested  Would patient like information on creating a medical advance directive? Yes (MAU/Ambulatory/Procedural Areas - Information given) - - - - No - Patient declined -    Tobacco Social History   Tobacco Use  Smoking Status Never Smoker  Smokeless Tobacco Never Used     Counseling given: Not Answered   Clinical Intake:  Pre-visit preparation completed: Yes  Pain : No/denies pain  Diabetes: Yes CBG done?: No Did pt. bring in CBG monitor from home?: No  How often do you need to have someone help you when you read instructions, pamphlets, or other written materials from your doctor or pharmacy?: 1 - Never  Interpreter Needed?: No  Information entered by :: Denman George LPN  Past Medical History:  Diagnosis Date  . Anxiety   . Arthritis   . Bipolar disorder ([place]) 12/09/2018  . Controlled type 2 diabetes mellitus without complication, without long-term current use of insulin ([place]) 12/09/2018  . History of kidney stones   .  Hypoglycemia   . Morbid obesity ([place]) 12/09/2018  . Nephrolithiasis 12/09/2018  . Obesity   . Post-operative hypothyroidism 12/09/2018  . Thyroid cancer [facility])    Past Surgical History:  Procedure Laterality Date  . ESOPHAGOGASTRODUODENOSCOPY (EGD) WITH PROPOFOL N/A 12/23/2016   Procedure: ESOPHAGOGASTRODUODENOSCOPY (EGD) WITH PROPOFOL;  Surgeon: Ladene Artist, MD;  Location: [facility] ENDOSCOPY;  Service: Endoscopy;  Laterality: N/A;  . HEEL SPUR SURGERY Left   . LITHOTRIPSY    . PARTIAL HYSTERECTOMY    . rotator cuff surgery Bilateral   . THYROIDECTOMY    . TONSILLECTOMY AND ADENOIDECTOMY     and adenoidectomy    Family History  Problem Relation Age of Onset  . Ovarian cancer Mother   . Colon cancer Neg Hx   . Esophageal cancer Neg Hx   . Pancreatic cancer Neg Hx   . Stomach cancer Neg Hx   . Liver disease Neg Hx   . Thyroid cancer Neg Hx   . Thyroid disease Neg Hx    Social History   Socioeconomic History  . Marital status: Single    Spouse name: Not on file  . Number of children: Not on file  . Years of education: Not on file  . Highest education level: Not on file  Occupational History  . Not on file  Social Needs  . Financial resource strain: Not on file  . Food insecurity    Worry: Not on file    Inability: Not on file  . Transportation needs  Medical: Not on file    Non-medical: Not on file  Tobacco Use  . Smoking status: Never Smoker  . Smokeless tobacco: Never Used  Substance and Sexual Activity  . Alcohol use: No  . Drug use: No  . Sexual activity: Never  Lifestyle  . Physical activity    Days per week: Not on file    Minutes per session: Not on file  . Stress: Not on file  Relationships  . Social Herbalist on phone: Not on file    Gets together: Not on file    Attends religious service: Not on file    Active member of club or organization: Not on file    Attends meetings of clubs or organizations: Not on file    Relationship status:  Not on file  Other Topics Concern  . Not on file  Social History Narrative  . Not on file    Outpatient Encounter Medications as of 02/01/2019  Medication Sig  . atorvastatin (LIPITOR) 20 MG tablet Take 1 tablet (20 mg total) by mouth at bedtime.  . Biotin 1 MG CAPS Take by mouth.  . Blood Glucose Monitoring Suppl (ONE TOUCH ULTRA 2) w/Device KIT Test blood sugar one to two times daily  . Coenzyme Q10 10 MG capsule Take 10 mg by mouth daily.  . diphenhydrAMINE (BENADRYL) 50 MG capsule Take 50 mg by mouth every 6 (six) hours as needed.  . Flaxseed, Linseed, (FLAX SEEDS PO) Take by mouth.  . Glucosamine-Chondroit-Vit C-Mn (GLUCOSAMINE 1500 COMPLEX PO) Take 2 tablets by mouth daily.  Marland Kitchen glucose blood test strip Check blood sugar one - two times daily  . Lactase (DAIRY AID PO) Take by mouth.  . Lancets (ONETOUCH ULTRASOFT) lancets Check blood sugar one - two times daily  . levothyroxine (SYNTHROID) 200 MCG tablet Take 1 tablet (200 mcg total) by mouth daily before breakfast. Replacing the 136mg dose.  .Marland Kitchenlisinopril (ZESTRIL) 5 MG tablet Take 1 tablet (5 mg total) by mouth daily.  .Marland KitchenLITHOBID 300 MG CR tablet Take 600 mg by mouth at bedtime.   .Marland KitchenLORazepam (ATIVAN) 0.5 MG tablet Take 1 tablet by mouth 2 (two) times daily as needed.  . Multiple Vitamin (DAILY VITAMIN PO) Take 1 tablet by mouth daily.  . pantoprazole (PROTONIX) 20 MG tablet Take 1 tablet by mouth daily as needed.  . Rhubarb (ESTROVEN MENOPAUSE RELIEF) 4 MG TABS Take by mouth.  . vitamin E 200 UNIT capsule Take 200 Units by mouth daily.   No facility-administered encounter medications on file as of 02/01/2019.     Activities of Daily Living In your present state of health, do you have any difficulty performing the following activities: 02/01/2019  Hearing? N  Vision? N  Difficulty concentrating or making decisions? N  Walking or climbing stairs? N  Dressing or bathing? N  Doing errands, shopping? N  Preparing Food and  eating ? N  Using the Toilet? N  In the past six months, have you accidently leaked urine? N  Do you have problems with loss of bowel control? N  Managing your Medications? N  Managing your Finances? N  Housekeeping or managing your Housekeeping? N  Some recent data might be hidden    Patient Care Team: ALeamon Arnt MD as PCP - General (Family Medicine) PNoemi Chapel NP as Nurse Practitioner JLaurence Aly OSimsas Consulting Physician (Optometry)    Assessment:   This is a routine wellness examination for DDala  Exercise Activities and Dietary recommendations Current Exercise Habits: The patient does not participate in regular exercise at present  Goals    . Weight (lb) < 250 lb (113.4 kg)     Patient would like to continue to lose weight and is interested in surgery to remove excess skin.        Fall Risk Fall Risk  02/01/2019  Falls in the past year? 1  Number falls in past yr: 0  Injury with Fall? 1  Risk for fall due to : Impaired balance/gait  Follow up Education provided;Falls prevention discussed;Falls evaluation completed   Is the patient's home free of loose throw rugs in walkways, pet beds, electrical cords, etc?   yes      Grab bars in the bathroom? yes      Handrails on the stairs?   yes      Adequate lighting?   yes  Depression Screen PHQ 2/9 Scores 02/01/2019  PHQ - 2 Score 0     Cognitive Function- noted cognitive deficit (no concerns at this time with memory)     Immunization History  Administered Date(s) Administered  . Influenza,inj,Quad PF,6+ Mos 12/27/2016, 12/09/2018    Qualifies for Shingles Vaccine?Discussed and patient will check with pharmacy for coverage.  Patient education handout provided    Screening Tests Health Maintenance  Topic Date Due  . OPHTHALMOLOGY EXAM  06/17/1978  . HIV Screening  06/18/1983  . MAMMOGRAM  06/17/2018  . COLONOSCOPY  06/17/2018  . HEMOGLOBIN A1C  06/11/2019  . FOOT EXAM  12/09/2019  .  TETANUS/TDAP  12/09/2023  . INFLUENZA VACCINE  Completed  . PNEUMOCOCCAL POLYSACCHARIDE VACCINE AGE 9-64 HIGH RISK  Completed    Cancer Screenings: Lung: Low Dose CT Chest recommended if Age 62-80 years, 30 pack-year currently smoking OR have quit w/in 15years. Patient does not qualify. Breast:  Up to date on Mammogram? Ordered at last office visit  Up to date of Bone Density/Dexa? Yes Colorectal: referral to GI at last office visit     Plan:  I have personally reviewed and addressed the Medicare Annual Wellness questionnaire and have noted the following in the patient's chart:  A. Medical and social history B. Use of alcohol, tobacco or illicit drugs  C. Current medications and supplements D. Functional ability and status E.  Nutritional status F.  Physical activity G. Advance directives H. List of other physicians I.  Hospitalizations, surgeries, and ER visits in previous 12 months J.  [place] such as hearing and vision if needed, cognitive and depression L. Referrals, records requested, and appointments- none   In addition, I have reviewed and discussed with patient certain preventive protocols, quality metrics, and best practice recommendations. A written personalized care plan for preventive services as well as general preventive health recommendations were provided to patient.   Signed,  Denman George, LPN  Nurse Health Advisor   Nurse Notes: Patient would like advice on if seeing a specialists for excess skin removal is something that she should pursue.  States that excess skin on inner thighs inhibits her from exercising in the capacity that she would like to. She even feels that it may have contributed to her last fall.

## 2019-02-02 NOTE — Progress Notes (Signed)
I have reviewed the documentation from the recent AWV done by Courtney Slade, RN; I agree with the documentation and will follow up on any recommendations or abnormal findings as suggested.  

## 2019-02-23 ENCOUNTER — Telehealth: Payer: Self-pay

## 2019-02-23 NOTE — Telephone Encounter (Signed)
Copied from [place] [phone]. Topic: General - Inquiry >> Feb 23, 2019  3:16 PM Mathis Bud wrote: Reason for CRM: Patient states she needs mammogram done by her appt in December with PCP. Patient states PCP was suppose to set up referral for her. Patient call back [phone] >> Feb 23, 2019  3:22 PM Morey Hummingbird wrote: Routed incorrectly

## 2019-02-28 ENCOUNTER — Encounter: Payer: Self-pay | Admitting: Family Medicine

## 2019-02-28 NOTE — Telephone Encounter (Signed)
Called and gave [facility] the [facility] so she can call and make appointment for mammogram.

## 2019-03-04 ENCOUNTER — Telehealth: Payer: Self-pay | Admitting: *Deleted

## 2019-03-04 NOTE — Telephone Encounter (Signed)
Pt called back and said she still does want to do it and I gave her the number to to GI [phone]

## 2019-03-04 NOTE — Telephone Encounter (Signed)
Left message on voicemail to call office.  

## 2019-03-04 NOTE — Telephone Encounter (Signed)
Please call pt: she did not f/u for GI referral.  Needs colonscopy and eval for diarrhea.  Please ask her if she still would like to get scheduled.

## 2019-03-16 ENCOUNTER — Ambulatory Visit
Admission: RE | Admit: 2019-03-16 | Discharge: 2019-03-16 | Disposition: A | Discharge status: Home / Self Care | Payer: Medicare Other | Source: Ambulatory Visit | Attending: Family Medicine | Admitting: Family Medicine

## 2019-03-16 ENCOUNTER — Other Ambulatory Visit: Payer: Self-pay

## 2019-03-16 ENCOUNTER — Other Ambulatory Visit: Payer: Self-pay | Admitting: Internal Medicine

## 2019-03-16 DIAGNOSIS — Z1231 Encounter for screening mammogram for malignant neoplasm of breast: Secondary | ICD-10-CM

## 2019-03-16 DIAGNOSIS — N63 Unspecified lump in unspecified breast: Secondary | ICD-10-CM

## 2019-03-25 ENCOUNTER — Encounter: Payer: Self-pay | Admitting: Family Medicine

## 2019-03-25 ENCOUNTER — Telehealth: Payer: Self-pay | Admitting: Family Medicine

## 2019-03-25 ENCOUNTER — Other Ambulatory Visit: Payer: Self-pay

## 2019-03-25 ENCOUNTER — Ambulatory Visit (INDEPENDENT_AMBULATORY_CARE_PROVIDER_SITE_OTHER): Payer: Medicare Other | Admitting: Family Medicine

## 2019-03-25 DIAGNOSIS — J309 Allergic rhinitis, unspecified: Secondary | ICD-10-CM

## 2019-03-25 NOTE — Telephone Encounter (Signed)
Patient had to reschedule her Physical due to a sore throat and sinus drainage on 03/25/19. She is requesting to be worked in for her physical before the end of the year on a Monday, Tuesday, or Friday around 1 or 2pm if possible. Please advise if we can work patient in, thank you!
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
## 2019-03-28 ENCOUNTER — Ambulatory Visit (INDEPENDENT_AMBULATORY_CARE_PROVIDER_SITE_OTHER): Payer: Medicare Other | Admitting: Physician Assistant

## 2019-03-28 ENCOUNTER — Other Ambulatory Visit: Payer: Self-pay

## 2019-03-28 ENCOUNTER — Encounter: Payer: Self-pay | Admitting: Physician Assistant

## 2019-03-28 VITALS — BP 124/80 | HR 76 | Temp 97.1°F | Ht 59.5 in | Wt 350.8 lb

## 2019-03-28 DIAGNOSIS — Z1211 Encounter for screening for malignant neoplasm of colon: Secondary | ICD-10-CM

## 2019-03-28 DIAGNOSIS — K6289 Other specified diseases of anus and rectum: Secondary | ICD-10-CM

## 2019-03-28 MED ORDER — NA SULFATE-K SULFATE-MG SULF 17.5-3.13-1.6 GM/177ML PO SOLN: ORAL | 0 refills | Status: DC
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
## 2019-03-28 NOTE — Patient Instructions (Signed)
If you are age 50 or older, your body mass index should be between 23-30. Your Body mass index is 69.67 kg/m. If this is out of the aforementioned range listed, please consider follow up with your Primary Care Provider.  If you are age 68 or younger, your body mass index should be between 19-25. Your Body mass index is 69.67 kg/m. If this is out of the aformentioned range listed, please consider follow up with your Primary Care Provider.   You have been scheduled for a colonoscopy. Please follow written instructions given to you at your visit today.  Please pick up your prep supplies at the pharmacy within the next 1-3 days. If you use inhalers (even only as needed), please bring them with you on the day of your procedure. Your physician has requested that you go to www.startemmi.com and enter the access code given to you at your visit today. This web site gives a general overview about your procedure. However, you should still follow specific instructions given to you by our office regarding your preparation for the procedure.  We have sent the following medications to your pharmacy for you to pick up at your convenience: Suprep  Due to recent COVID-19 restrictions implemented by our local and state authorities and in an effort to keep both patients and staff as safe as possible, our hospital system now requires COVID-19 testing prior to any scheduled hospital procedure. Please go to our [facility] location drive thru testing site ([address], [place], [place] [address]) on 05/05/19 at  9 am. There will be multiple testing areas, the first checkpoint being for pre-procedure/surgery testing. Get into the right (yellow) lane that leads to the PAT testing team. You will not be billed at the time of testing but may receive a bill later depending on your insurance. The approximate cost of the test is $100. You must agree to quarantine from the time of your testing until the procedure date on  05/09/19 . This should include staying at home with ONLY the people you live with. Avoid take-out, grocery store shopping or leaving the house for any non-emergent reason. Failure to have your COVID-19 test done on the date and time you have been scheduled will result in cancellation of procedure. Please call our office at [phone] if you have any questions.    Thank you for choosing me and [facility].    Ellouise Newer, PA-C

## 2019-03-28 NOTE — Progress Notes (Signed)
Reviewed and agree with management plan.  Shahla Betsill T. Marah Park, MD FACG [facility]  

## 2019-03-28 NOTE — Telephone Encounter (Signed)
Checked Dr. Tamela Oddi schedule for requested availability, she is completely booked during those times.

## 2019-03-29 ENCOUNTER — Encounter: Payer: Medicare Other | Admitting: Gastroenterology

## 2019-04-18 ENCOUNTER — Other Ambulatory Visit: Payer: Self-pay

## 2019-04-19 ENCOUNTER — Encounter: Payer: Medicare Other | Admitting: Family Medicine

## 2019-04-20 ENCOUNTER — Ambulatory Visit: Payer: Self-pay

## 2019-04-20 NOTE — Telephone Encounter (Signed)
Noted.  Patient scheduled for appointment with Dr. Jonni Sanger on 12/31 @ 10:40.
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
## 2019-04-20 NOTE — Telephone Encounter (Signed)
See note

## 2019-04-21 ENCOUNTER — Ambulatory Visit (INDEPENDENT_AMBULATORY_CARE_PROVIDER_SITE_OTHER): Payer: Medicare Other | Admitting: Family Medicine

## 2019-04-21 ENCOUNTER — Encounter: Payer: Self-pay | Admitting: Family Medicine

## 2019-04-21 VITALS — Ht 59.5 in | Wt 350.0 lb

## 2019-04-21 DIAGNOSIS — U071 COVID-19: Secondary | ICD-10-CM
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
## 2019-04-23 NOTE — Progress Notes (Signed)
Covid-19 Home Visit on behalf of [facility] on 04/22/19.   Visit details transcribed by Glory Buff, DNP in [facility].

## 2019-05-04 NOTE — Progress Notes (Signed)
Pre-op endo call attempted. No answer. Lmtcb.  

## 2019-05-05 ENCOUNTER — Telehealth: Payer: Self-pay | Admitting: Gastroenterology

## 2019-05-05 ENCOUNTER — Inpatient Hospital Stay (HOSPITAL_COMMUNITY): Admission: RE | Admit: 2019-05-05 | Payer: Medicare Other | Source: Ambulatory Visit

## 2019-05-05 NOTE — Telephone Encounter (Signed)
Patient notified that case has been cancelled.  I asked her to call back in April to check on rescheduling. Hospital elective cases are on hold as of 1/25

## 2019-05-09 ENCOUNTER — Ambulatory Visit (HOSPITAL_COMMUNITY): Admission: RE | Admit: 2019-05-09 | Payer: Medicare Other | Source: Home / Self Care | Admitting: Gastroenterology

## 2019-05-09 ENCOUNTER — Encounter (HOSPITAL_COMMUNITY): Admission: RE | Payer: Self-pay | Source: Home / Self Care

## 2019-05-09 SURGERY — COLONOSCOPY WITH PROPOFOL
Anesthesia: Monitor Anesthesia Care

## 2019-05-13 ENCOUNTER — Encounter: Payer: Self-pay | Admitting: Family Medicine

## 2019-05-13 ENCOUNTER — Ambulatory Visit (INDEPENDENT_AMBULATORY_CARE_PROVIDER_SITE_OTHER): Payer: Medicare Other | Admitting: Family Medicine

## 2019-05-13 ENCOUNTER — Encounter: Payer: Medicare Other | Admitting: Family Medicine

## 2019-05-13 VITALS — BP 132/74 | HR 91 | Temp 98.1°F | Ht 59.5 in | Wt 336.2 lb

## 2019-05-13 DIAGNOSIS — Z5309 Procedure and treatment not carried out because of other contraindication: Secondary | ICD-10-CM | POA: Insufficient documentation

## 2019-05-13 DIAGNOSIS — I872 Venous insufficiency (chronic) (peripheral): Secondary | ICD-10-CM | POA: Insufficient documentation

## 2019-05-13 DIAGNOSIS — F319 Bipolar disorder, unspecified: Secondary | ICD-10-CM

## 2019-05-13 DIAGNOSIS — Z85850 Personal history of malignant neoplasm of thyroid: Secondary | ICD-10-CM

## 2019-05-13 DIAGNOSIS — Z1231 Encounter for screening mammogram for malignant neoplasm of breast: Secondary | ICD-10-CM

## 2019-05-13 DIAGNOSIS — E119 Type 2 diabetes mellitus without complications: Secondary | ICD-10-CM

## 2019-05-13 DIAGNOSIS — Z0000 Encounter for general adult medical examination without abnormal findings: Secondary | ICD-10-CM | POA: Diagnosis not present

## 2019-05-13 DIAGNOSIS — Z23 Encounter for immunization: Secondary | ICD-10-CM | POA: Diagnosis not present

## 2019-05-13 DIAGNOSIS — E782 Mixed hyperlipidemia: Secondary | ICD-10-CM | POA: Diagnosis not present

## 2019-05-13 DIAGNOSIS — E890 Postprocedural hypothyroidism: Secondary | ICD-10-CM | POA: Diagnosis not present

## 2019-05-13 DIAGNOSIS — R197 Diarrhea, unspecified: Secondary | ICD-10-CM

## 2019-05-13 DIAGNOSIS — E1169 Type 2 diabetes mellitus with other specified complication: Secondary | ICD-10-CM

## 2019-05-13 DIAGNOSIS — N951 Menopausal and female climacteric states: Secondary | ICD-10-CM

## 2019-05-13 LAB — POCT GLYCOSYLATED HEMOGLOBIN (HGB A1C): Hemoglobin A1C: 7.2 % — AB (ref 4.0–5.6)

## 2019-05-13 MED ORDER — BYDUREON BCISE 2 MG/0.85ML ~~LOC~~ AUIJ: 2.0000 mg | AUTO-INJECTOR | SUBCUTANEOUS | 3 refills | Status: DC

## 2019-05-13 MED ORDER — GABAPENTIN 300 MG PO CAPS: 300.0000 mg | ORAL_CAPSULE | Freq: Every day | ORAL | 0 refills | Status: DC
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
## 2019-05-13 NOTE — Patient Instructions (Addendum)
Please return in 3 months for diabetes follow up and thyroid.  You are due for a mammogram. I have ordered it. We will call you to get it scheduled. Please set up an appointment for a diabetic eye exam and have the results sent to me.  Please stop taking biotin. This can interfere with your thyroid test.  Start gabapentin at night to help with your hot flushes and sleep.  Start the Bcise weekly to treat your diabetes and help with weight loss.   If you have any questions or concerns, please don't hesitate to send me a message via MyChart or call the office at [phone]. Thank you for visiting with Megan Moreno today! It's our pleasure caring for you.   Menopause Menopause is the normal time of life when menstrual periods stop completely. It is usually confirmed by 12 months without a menstrual period. The transition to menopause (perimenopause) most often happens between the ages of 68 and 1. During perimenopause, hormone levels change in your body, which can cause symptoms and affect your health. Menopause may increase your risk for:  Loss of bone (osteoporosis), which causes bone breaks (fractures).  Depression.  Hardening and narrowing of the arteries (atherosclerosis), which can cause heart attacks and strokes. What are the causes? This condition is usually caused by a natural change in hormone levels that happens as you get older. The condition may also be caused by surgery to remove both ovaries (bilateral oophorectomy). What increases the risk? This condition is more likely to start at an earlier age if you have certain medical conditions or treatments, including:  A tumor of the pituitary gland in the brain.  A disease that affects the ovaries and hormone production.  Radiation treatment for cancer.  Certain cancer treatments, such as chemotherapy or hormone (anti-estrogen) therapy.  Heavy smoking and excessive alcohol use.  Family history of early menopause. This condition is  also more likely to develop earlier in women who are very thin. What are the signs or symptoms? Symptoms of this condition include:  Hot flashes.  Irregular menstrual periods.  Night sweats.  Changes in feelings about sex. This could be a decrease in sex drive or an increased comfort around your sexuality.  Vaginal dryness and thinning of the vaginal walls. This may cause painful intercourse.  Dryness of the skin and development of wrinkles.  Headaches.  Problems sleeping (insomnia).  Mood swings or irritability.  Memory problems.  Weight gain.  Hair growth on the face and chest.  Bladder infections or problems with urinating. How is this diagnosed? This condition is diagnosed based on your medical history, a physical exam, your age, your menstrual history, and your symptoms. Hormone tests may also be done. How is this treated? In some cases, no treatment is needed. You and your health care provider should make a decision together about whether treatment is necessary. Treatment will be based on your individual condition and preferences. Treatment for this condition focuses on managing symptoms. Treatment may include:  Menopausal hormone therapy (MHT).  Medicines to treat specific symptoms or complications.  Acupuncture.  Vitamin or herbal supplements. Before starting treatment, make sure to let your health care provider know if you have a personal or family history of:  Heart disease.  Breast cancer.  Blood clots.  Diabetes.  Osteoporosis. Follow these instructions at home: Lifestyle  Do not use any products that contain nicotine or tobacco, such as cigarettes and e-cigarettes. If you need help quitting, ask your health care provider.  Get at least 30 minutes of physical activity on 5 or more days each week.  Avoid alcoholic and caffeinated beverages, as well as spicy foods. This may help prevent hot flashes.  Get 7-8 hours of sleep each night.  If you  have hot flashes, try: ? Dressing in layers. ? Avoiding things that may trigger hot flashes, such as spicy food, warm places, or stress. ? Taking slow, deep breaths when a hot flash starts. ? Keeping a fan in your home and office.  Find ways to manage stress, such as deep breathing, meditation, or journaling.  Consider going to group therapy with other women who are having menopause symptoms. Ask your health care provider about recommended group therapy meetings. Eating and drinking  Eat a healthy, balanced diet that contains whole grains, lean protein, low-fat dairy, and plenty of fruits and vegetables.  Your health care provider may recommend adding more soy to your diet. Foods that contain soy include tofu, tempeh, and soy milk.  Eat plenty of foods that contain calcium and vitamin D for bone health. Items that are rich in calcium include low-fat milk, yogurt, beans, almonds, sardines, broccoli, and kale. Medicines  Take over-the-counter and prescription medicines only as told by your health care provider.  Talk with your health care provider before starting any herbal supplements. If prescribed, take vitamins and supplements as told by your health care provider. These may include: ? Calcium. Women age 61 and older should get 1,200 mg (milligrams) of calcium every day. ? Vitamin D. Women need 600-800 International Units of vitamin D each day. ? Vitamins B12 and B6. Aim for 50 micrograms of B12 and 1.5 mg of B6 each day. General instructions  Keep track of your menstrual periods, including: ? When they occur. ? How heavy they are and how long they last. ? How much time passes between periods.  Keep track of your symptoms, noting when they start, how often you have them, and how long they last.  Use vaginal lubricants or moisturizers to help with vaginal dryness and improve comfort during sex.  Keep all follow-up visits as told by your health care provider. This is important. This  includes any group therapy or counseling. Contact a health care provider if:  You are still having menstrual periods after age 21.  You have pain during sex.  You have not had a period for 12 months and you develop vaginal bleeding. Get help right away if:  You have: ? Severe depression. ? Excessive vaginal bleeding. ? Pain when you urinate. ? A fast or irregular heart beat (palpitations). ? Severe headaches. ? Abdomen (abdominal) pain or severe indigestion.  You fell and you think you have a broken bone.  You develop leg or chest pain.  You develop vision problems.  You feel a lump in your breast. Summary  Menopause is the normal time of life when menstrual periods stop completely. It is usually confirmed by 12 months without a menstrual period.  The transition to menopause (perimenopause) most often happens between the ages of 58 and 47.  Symptoms can be managed through medicines, lifestyle changes, and complementary therapies such as acupuncture.  Eat a balanced diet that is rich in nutrients to promote bone health and heart health and to manage symptoms during menopause. This information is not intended to replace advice given to you by your health care provider. Make sure you discuss any questions you have with your health care provider. Document Revised: 03/20/2017 Document Reviewed: 05/10/2016  Elsevier Patient Education  [employer].

## 2019-05-14 LAB — LIPID PANEL
Cholesterol: 229 mg/dL — ABNORMAL HIGH (ref <200)
HDL: 53 mg/dL (ref >=50)
LDL Cholesterol (Calc): 140 mg/dL (calc) — ABNORMAL HIGH
Non-HDL Cholesterol (Calc): 176 mg/dL (calc) — ABNORMAL HIGH (ref <130)
Total CHOL/HDL Ratio: 4.3 (calc) (ref <5.0)
Triglycerides: 226 mg/dL — ABNORMAL HIGH (ref <150)

## 2019-05-14 LAB — COMPREHENSIVE METABOLIC PANEL
AG Ratio: 1.1 (calc) (ref 1.0–2.5)
ALT: 28 U/L (ref 6–29)
AST: 25 U/L (ref 10–35)
Albumin: 3.6 g/dL (ref 3.6–5.1)
Alkaline phosphatase (APISO): 85 U/L (ref 37–153)
BUN: 22 mg/dL (ref 7–25)
CO2: 23 mmol/L (ref 20–32)
Calcium: 9.7 mg/dL (ref 8.6–10.4)
Chloride: 102 mmol/L (ref 98–110)
Creat: 0.95 mg/dL (ref 0.50–1.05)
Globulin: 3.2 g/dL (calc) (ref 1.9–3.7)
Glucose, Bld: 126 mg/dL — ABNORMAL HIGH (ref 65–99)
Potassium: 3.8 mmol/L (ref 3.5–5.3)
Sodium: 138 mmol/L (ref 135–146)
Total Bilirubin: 0.7 mg/dL (ref 0.2–1.2)
Total Protein: 6.8 g/dL (ref 6.1–8.1)

## 2019-05-14 LAB — CBC WITH DIFFERENTIAL/PLATELET
Absolute Monocytes: 748 cells/uL (ref 200–950)
Basophils Absolute: 17 cells/uL (ref 0–200)
Basophils Relative: 0.2 %
Eosinophils Absolute: 165 cells/uL (ref 15–500)
Eosinophils Relative: 1.9 %
HCT: 44.3 % (ref 35.0–45.0)
Hemoglobin: 14.9 g/dL (ref 11.7–15.5)
Lymphs Abs: 2619 cells/uL (ref 850–3900)
MCH: 31.6 pg (ref 27.0–33.0)
MCHC: 33.6 g/dL (ref 32.0–36.0)
MCV: 94.1 fL (ref 80.0–100.0)
MPV: 11.5 fL (ref 7.5–12.5)
Monocytes Relative: 8.6 %
Neutro Abs: 5150 cells/uL (ref 1500–7800)
Neutrophils Relative %: 59.2 %
Platelets: 225 10*3/uL (ref 140–400)
RBC: 4.71 10*6/uL (ref 3.80–5.10)
RDW: 13 % (ref 11.0–15.0)
Total Lymphocyte: 30.1 %
WBC: 8.7 10*3/uL (ref 3.8–10.8)

## 2019-05-14 LAB — TSH: TSH: 17.78 mIU/L — ABNORMAL HIGH

## 2019-05-14 MED ORDER — LEVOTHYROXINE SODIUM 112 MCG PO TABS: 224.0000 ug | ORAL_TABLET | Freq: Every day | ORAL | 3 refills | Status: DC

## 2019-05-14 NOTE — Progress Notes (Signed)
Please call patient: I have reviewed his/her lab results. Thyroid test is improved but remains low. Could be related to biotin. Will increase thyroid dose; please ensure she knows that new dose will be 264mcg daily and will require her to take TWO tablets every morning. I recommend office visit in 8 weeks for recheck. Ensure she stops the biotin. We discussed this yesterday. Please schedule that visit.  Cholesterol is improved but remains high; please increase dose of crestor to 40mg  nightly. Pt can take two of her tablets and I will refill the larger pill at her next visit.

## 2019-05-14 NOTE — Addendum Note (Signed)
Addended by: Billey Chang on: 05/14/2019 10:56 AM   Modules accepted: Orders

## 2019-06-10 DIAGNOSIS — E119 Type 2 diabetes mellitus without complications: Secondary | ICD-10-CM | POA: Diagnosis not present

## 2019-06-10 LAB — HM DIABETES EYE EXAM

## 2019-06-29 ENCOUNTER — Ambulatory Visit: Payer: Medicare Other | Attending: Internal Medicine

## 2019-06-29 DIAGNOSIS — Z20822 Contact with and (suspected) exposure to covid-19: Secondary | ICD-10-CM

## 2019-06-30 LAB — NOVEL CORONAVIRUS, NAA: SARS-CoV-2, NAA: NOT DETECTED

## 2019-07-01 ENCOUNTER — Other Ambulatory Visit: Payer: Self-pay

## 2019-07-01 ENCOUNTER — Ambulatory Visit
Admission: RE | Admit: 2019-07-01 | Discharge: 2019-07-01 | Disposition: A | Discharge status: Home / Self Care | Payer: Medicare Other | Source: Ambulatory Visit | Attending: Family Medicine | Admitting: Family Medicine

## 2019-07-01 ENCOUNTER — Other Ambulatory Visit: Payer: Self-pay | Admitting: Family Medicine

## 2019-07-01 DIAGNOSIS — Z1231 Encounter for screening mammogram for malignant neoplasm of breast: Secondary | ICD-10-CM

## 2019-07-01 DIAGNOSIS — N63 Unspecified lump in unspecified breast: Secondary | ICD-10-CM

## 2019-07-14 ENCOUNTER — Other Ambulatory Visit: Payer: Self-pay

## 2019-07-15 ENCOUNTER — Ambulatory Visit (INDEPENDENT_AMBULATORY_CARE_PROVIDER_SITE_OTHER): Payer: Medicare Other | Admitting: Family Medicine

## 2019-07-15 VITALS — BP 126/88 | HR 89 | Temp 97.9°F | Ht 59.5 in | Wt 346.2 lb

## 2019-07-15 DIAGNOSIS — Z79899 Other long term (current) drug therapy: Secondary | ICD-10-CM | POA: Diagnosis not present

## 2019-07-15 DIAGNOSIS — E782 Mixed hyperlipidemia: Secondary | ICD-10-CM

## 2019-07-15 DIAGNOSIS — E890 Postprocedural hypothyroidism: Secondary | ICD-10-CM | POA: Diagnosis not present

## 2019-07-15 DIAGNOSIS — N951 Menopausal and female climacteric states: Secondary | ICD-10-CM | POA: Diagnosis not present

## 2019-07-15 DIAGNOSIS — E1169 Type 2 diabetes mellitus with other specified complication: Secondary | ICD-10-CM | POA: Diagnosis not present

## 2019-07-15 DIAGNOSIS — E1140 Type 2 diabetes mellitus with diabetic neuropathy, unspecified: Secondary | ICD-10-CM

## 2019-07-15 MED ORDER — GABAPENTIN 300 MG PO CAPS: 300.0000 mg | ORAL_CAPSULE | Freq: Two times a day (BID) | ORAL | 3 refills | Status: DC

## 2019-07-15 MED ORDER — ATORVASTATIN CALCIUM 40 MG PO TABS: 40.0000 mg | ORAL_TABLET | Freq: Every day | ORAL | 3 refills | Status: DC
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
## 2019-07-15 NOTE — Patient Instructions (Addendum)
Please return in 2 months for diabetes and cholesterol follow up.  Look up instructions on how to use the BCise pen on the computer: google or you tube.   If you have any questions or concerns, please don't hesitate to send me a message via MyChart or call the office at [phone]. Thank you for visiting with [place] today! It's our pleasure caring for you.  Please set up an appointment for a diabetic eye exam and have the results sent to me.

## 2019-07-16 ENCOUNTER — Encounter: Payer: Self-pay | Admitting: Family Medicine

## 2019-07-16 LAB — LITHIUM LEVEL: Lithium Lvl: 0.3 mmol/L — ABNORMAL LOW (ref 0.6–1.2)

## 2019-07-16 LAB — TSH: TSH: 3.55 mIU/L

## 2019-07-16 NOTE — Progress Notes (Signed)
Please call patient: I have reviewed his/her lab results. Thyroid level is now at goal. Continue current dose of medication: should be taking two 165mcg tablets daily for total dose of 219mcg.

## 2019-07-21 ENCOUNTER — Telehealth: Payer: Self-pay | Admitting: Physician Assistant

## 2019-07-25 NOTE — Telephone Encounter (Signed)
What time does he like to start? 730 am?

## 2019-07-25 NOTE — Telephone Encounter (Signed)
Left message on machine to call back  

## 2019-07-25 NOTE — Telephone Encounter (Signed)
You can't start until 8:30 on a Tues because Dr. Collene Mares has the hour slot from 7:30-8:30

## 2019-07-25 NOTE — Telephone Encounter (Signed)
6/15 

## 2019-07-25 NOTE — Telephone Encounter (Signed)
Can you tell me when Dr Monacelli Plan can do this lady at the hospital?  What time does he start?  Thank you

## 2019-07-26 ENCOUNTER — Encounter: Payer: Self-pay | Admitting: Family Medicine

## 2019-07-26 ENCOUNTER — Other Ambulatory Visit: Payer: Self-pay

## 2019-07-26 DIAGNOSIS — K6289 Other specified diseases of anus and rectum: Secondary | ICD-10-CM

## 2019-07-26 DIAGNOSIS — Z1211 Encounter for screening for malignant neoplasm of colon: Secondary | ICD-10-CM

## 2019-07-26 NOTE — Telephone Encounter (Signed)
The pt has been scheduled for colon at [facility] with Dr Scheidegger Plan on 6/15 at 830 am.  Covid appt on 6/11 at 50 am.  Information has been provided via [facility] Chart as well as message left for the pt to return call to discuss.

## 2019-07-26 NOTE — Telephone Encounter (Signed)
Patient is returning your call.  

## 2019-07-27 ENCOUNTER — Other Ambulatory Visit: Payer: Medicare Other

## 2019-07-27 NOTE — Telephone Encounter (Signed)
My Chart message sent to the pt as well to contact our office to discuss

## 2019-07-27 NOTE — Telephone Encounter (Signed)
Left message on home machine to call back Tried the cell number and the voice mail had another name attached to it so no message left.

## 2019-07-28 NOTE — Telephone Encounter (Signed)
Left message on machine to call back I have been unable to reach pt.  Several messages left and My Chart message also sent to the pt.

## 2019-07-28 NOTE — Telephone Encounter (Signed)
Instructions have also been mailed to the pt home

## 2019-08-01 ENCOUNTER — Other Ambulatory Visit: Payer: Self-pay | Admitting: Family Medicine

## 2019-08-05 ENCOUNTER — Encounter: Payer: Self-pay | Admitting: Family Medicine

## 2019-08-05 ENCOUNTER — Telehealth (INDEPENDENT_AMBULATORY_CARE_PROVIDER_SITE_OTHER): Payer: Medicare Other | Admitting: Family Medicine

## 2019-08-05 ENCOUNTER — Ambulatory Visit: Payer: Medicare Other | Admitting: Family Medicine

## 2019-08-05 ENCOUNTER — Other Ambulatory Visit: Payer: Self-pay

## 2019-08-05 VITALS — HR 81

## 2019-08-05 DIAGNOSIS — E119 Type 2 diabetes mellitus without complications: Secondary | ICD-10-CM | POA: Diagnosis not present

## 2019-08-05 DIAGNOSIS — E782 Mixed hyperlipidemia: Secondary | ICD-10-CM

## 2019-08-05 DIAGNOSIS — N951 Menopausal and female climacteric states: Secondary | ICD-10-CM | POA: Diagnosis not present

## 2019-08-05 DIAGNOSIS — E890 Postprocedural hypothyroidism: Secondary | ICD-10-CM

## 2019-08-05 DIAGNOSIS — E1169 Type 2 diabetes mellitus with other specified complication: Secondary | ICD-10-CM

## 2019-08-05 MED ORDER — ATORVASTATIN CALCIUM 40 MG PO TABS: 40.0000 mg | ORAL_TABLET | Freq: Every day | ORAL | 3 refills | Status: DC
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
## 2019-08-11 ENCOUNTER — Other Ambulatory Visit: Payer: Self-pay

## 2019-08-11 ENCOUNTER — Ambulatory Visit
Admission: RE | Admit: 2019-08-11 | Discharge: 2019-08-11 | Disposition: A | Discharge status: Home / Self Care | Payer: Medicare Other | Source: Ambulatory Visit | Attending: Family Medicine | Admitting: Family Medicine

## 2019-08-11 DIAGNOSIS — R928 Other abnormal and inconclusive findings on diagnostic imaging of breast: Secondary | ICD-10-CM | POA: Diagnosis not present

## 2019-08-11 DIAGNOSIS — N6322 Unspecified lump in the left breast, upper inner quadrant: Secondary | ICD-10-CM | POA: Diagnosis not present

## 2019-08-11 DIAGNOSIS — N63 Unspecified lump in unspecified breast: Secondary | ICD-10-CM

## 2019-09-02 ENCOUNTER — Encounter: Payer: Medicare Other | Admitting: Family Medicine

## 2019-09-23 ENCOUNTER — Ambulatory Visit: Payer: Medicare Other | Admitting: Family Medicine

## 2019-09-30 ENCOUNTER — Other Ambulatory Visit (HOSPITAL_COMMUNITY): Admission: RE | Admit: 2019-09-30 | Payer: Medicare Other | Source: Ambulatory Visit

## 2019-10-03 ENCOUNTER — Telehealth: Payer: Self-pay

## 2019-10-03 NOTE — Telephone Encounter (Signed)
Patient reports that she didn't know about her instructions and had lost them.  She did not go for COVID screen, has not been on clear liquids today, and has not picked up her prep. She has been rescheduled for 12/13/19.  I advised her she will need a pre-visit prior to the procedure and that has been scheduled with her as well.

## 2019-10-03 NOTE — Progress Notes (Signed)
Attempted to call patient regarding covid test. Left message that patient needed to have a covid test prior to the procedure tomorrow. Left number to call back.

## 2019-10-03 NOTE — Telephone Encounter (Signed)
Patient is returning your call states she was not aware of having a covid test appt.

## 2019-10-03 NOTE — Telephone Encounter (Signed)
I received a call from [facility] .  Patient failed to go for her COVID screen for her procedure tomorrow.  They have not been able to reach the patient.  I left her a VM asking her to go today no later than 3:45 or she will not be able to have her procedure tomorrow.  I asked that she call back to confirm she got the message

## 2019-10-26 ENCOUNTER — Encounter: Payer: Self-pay | Admitting: Family Medicine

## 2019-10-26 ENCOUNTER — Ambulatory Visit: Payer: Medicare Other | Admitting: Family Medicine

## 2019-11-02 DIAGNOSIS — I509 Heart failure, unspecified: Secondary | ICD-10-CM | POA: Diagnosis not present

## 2019-11-02 DIAGNOSIS — I517 Cardiomegaly: Secondary | ICD-10-CM | POA: Diagnosis not present

## 2019-11-16 DIAGNOSIS — R05 Cough: Secondary | ICD-10-CM | POA: Diagnosis not present

## 2019-11-21 ENCOUNTER — Other Ambulatory Visit: Payer: Self-pay

## 2019-11-21 ENCOUNTER — Ambulatory Visit (INDEPENDENT_AMBULATORY_CARE_PROVIDER_SITE_OTHER): Payer: Medicare Other | Admitting: Family Medicine

## 2019-11-21 ENCOUNTER — Encounter: Payer: Self-pay | Admitting: Family Medicine

## 2019-11-21 VITALS — BP 140/100 | HR 90 | Temp 97.0°F | Resp 17 | Ht 60.0 in | Wt 345.4 lb

## 2019-11-21 DIAGNOSIS — I10 Essential (primary) hypertension: Secondary | ICD-10-CM

## 2019-11-21 DIAGNOSIS — N951 Menopausal and female climacteric states: Secondary | ICD-10-CM

## 2019-11-21 DIAGNOSIS — E119 Type 2 diabetes mellitus without complications: Secondary | ICD-10-CM

## 2019-11-21 DIAGNOSIS — E782 Mixed hyperlipidemia: Secondary | ICD-10-CM | POA: Diagnosis not present

## 2019-11-21 DIAGNOSIS — E1169 Type 2 diabetes mellitus with other specified complication: Secondary | ICD-10-CM | POA: Diagnosis not present

## 2019-11-21 DIAGNOSIS — E1159 Type 2 diabetes mellitus with other circulatory complications: Secondary | ICD-10-CM

## 2019-11-21 DIAGNOSIS — Z5309 Procedure and treatment not carried out because of other contraindication: Secondary | ICD-10-CM

## 2019-11-21 DIAGNOSIS — E890 Postprocedural hypothyroidism: Secondary | ICD-10-CM | POA: Diagnosis not present

## 2019-11-21 DIAGNOSIS — I872 Venous insufficiency (chronic) (peripheral): Secondary | ICD-10-CM | POA: Diagnosis not present

## 2019-11-21 DIAGNOSIS — I152 Hypertension secondary to endocrine disorders: Secondary | ICD-10-CM

## 2019-11-21 LAB — POCT GLYCOSYLATED HEMOGLOBIN (HGB A1C): Hemoglobin A1C: 6.8 % — AB (ref 4.0–5.6)

## 2019-11-21 MED ORDER — DILTIAZEM HCL ER COATED BEADS 120 MG PO CP24: 120.0000 mg | ORAL_CAPSULE | Freq: Every day | ORAL | 3 refills | Status: DC
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
## 2019-11-21 NOTE — Patient Instructions (Signed)
Please return in 3 months for diabetes and blood pressure  follow up  Please start the new blood pressure medications called diltiazem once a day.  Your diabetes looks great now! I will recheck your thyroid and cholesterol levels today.   If you have any questions or concerns, please don't hesitate to send me a message via MyChart or call the office at [phone]. Thank you for visiting with Megan Moreno today! It's our pleasure caring for you.

## 2019-11-22 LAB — COMPREHENSIVE METABOLIC PANEL
AG Ratio: 1.3 (calc) (ref 1.0–2.5)
ALT: 26 U/L (ref 6–29)
AST: 20 U/L (ref 10–35)
Albumin: 3.9 g/dL (ref 3.6–5.1)
Alkaline phosphatase (APISO): 109 U/L (ref 37–153)
BUN: 11 mg/dL (ref 7–25)
CO2: 28 mmol/L (ref 20–32)
Calcium: 9.7 mg/dL (ref 8.6–10.4)
Chloride: 103 mmol/L (ref 98–110)
Creat: 0.89 mg/dL (ref 0.50–1.05)
Globulin: 3.1 g/dL (calc) (ref 1.9–3.7)
Glucose, Bld: 139 mg/dL — ABNORMAL HIGH (ref 65–99)
Potassium: 4.1 mmol/L (ref 3.5–5.3)
Sodium: 140 mmol/L (ref 135–146)
Total Bilirubin: 0.5 mg/dL (ref 0.2–1.2)
Total Protein: 7 g/dL (ref 6.1–8.1)

## 2019-11-22 LAB — LIPID PANEL
Cholesterol: 143 mg/dL (ref <200)
HDL: 55 mg/dL (ref >=50)
LDL Cholesterol (Calc): 65 mg/dL (calc)
Non-HDL Cholesterol (Calc): 88 mg/dL (calc) (ref <130)
Total CHOL/HDL Ratio: 2.6 (calc) (ref <5.0)
Triglycerides: 144 mg/dL (ref <150)

## 2019-11-22 LAB — EXTRA LAV TOP TUBE

## 2019-11-22 LAB — TSH: TSH: 1.13 mIU/L

## 2019-11-25 DIAGNOSIS — H2513 Age-related nuclear cataract, bilateral: Secondary | ICD-10-CM | POA: Diagnosis not present

## 2019-11-25 DIAGNOSIS — H25043 Posterior subcapsular polar age-related cataract, bilateral: Secondary | ICD-10-CM | POA: Diagnosis not present

## 2019-11-25 DIAGNOSIS — H2512 Age-related nuclear cataract, left eye: Secondary | ICD-10-CM | POA: Diagnosis not present

## 2019-11-25 DIAGNOSIS — I10 Essential (primary) hypertension: Secondary | ICD-10-CM | POA: Diagnosis not present

## 2019-12-06 ENCOUNTER — Ambulatory Visit (AMBULATORY_SURGERY_CENTER): Payer: Self-pay | Admitting: *Deleted

## 2019-12-06 VITALS — Ht 59.0 in | Wt 343.8 lb

## 2019-12-06 DIAGNOSIS — Z01818 Encounter for other preprocedural examination: Secondary | ICD-10-CM

## 2019-12-06 DIAGNOSIS — Z1211 Encounter for screening for malignant neoplasm of colon: Secondary | ICD-10-CM

## 2019-12-06 NOTE — Progress Notes (Signed)
Patient denies any allergies to egg or soy products. Patient denies complications with anesthesia/sedation.  Patient denies oxygen use at home and denies diet medications. Emmi instructions for colonoscopy explained and given to patient. Patient scheduled at [facility] for her 12/13/19 colonoscopy procedure.  Covid test scheduled on Friday, 12/09/19 at 2:30 pm at the [place], [place] location.

## 2019-12-07 ENCOUNTER — Other Ambulatory Visit: Payer: Self-pay

## 2019-12-07 ENCOUNTER — Encounter (HOSPITAL_COMMUNITY): Payer: Self-pay | Admitting: Gastroenterology

## 2019-12-07 NOTE — Progress Notes (Signed)
Attempted to obtain medical history via telephone, unable to reach at this time. I left a voicemail to return pre surgical testing department's phone call.  

## 2019-12-09 ENCOUNTER — Other Ambulatory Visit (HOSPITAL_COMMUNITY)
Admission: RE | Admit: 2019-12-09 | Discharge: 2019-12-09 | Disposition: A | Discharge status: Home / Self Care | Payer: Medicare Other | Source: Ambulatory Visit | Attending: Gastroenterology | Admitting: Gastroenterology

## 2019-12-09 DIAGNOSIS — Z01812 Encounter for preprocedural laboratory examination: Secondary | ICD-10-CM | POA: Insufficient documentation

## 2019-12-09 DIAGNOSIS — Z20822 Contact with and (suspected) exposure to covid-19: Secondary | ICD-10-CM | POA: Insufficient documentation

## 2019-12-09 LAB — SARS CORONAVIRUS 2 (TAT 6-24 HRS): SARS Coronavirus 2: NEGATIVE

## 2019-12-13 ENCOUNTER — Other Ambulatory Visit: Payer: Self-pay

## 2019-12-13 ENCOUNTER — Ambulatory Visit (HOSPITAL_COMMUNITY): Payer: Medicare Other | Admitting: Anesthesiology

## 2019-12-13 ENCOUNTER — Encounter (HOSPITAL_COMMUNITY)
Admission: RE | Disposition: A | Discharge status: Home / Self Care | Payer: Self-pay | Source: Home / Self Care | Attending: Gastroenterology

## 2019-12-13 ENCOUNTER — Encounter (HOSPITAL_COMMUNITY): Payer: Self-pay | Admitting: Gastroenterology

## 2019-12-13 ENCOUNTER — Ambulatory Visit (HOSPITAL_COMMUNITY)
Admission: RE | Admit: 2019-12-13 | Discharge: 2019-12-13 | Disposition: A | Discharge status: Home / Self Care | Payer: Medicare Other | Attending: Gastroenterology | Admitting: Gastroenterology

## 2019-12-13 DIAGNOSIS — K625 Hemorrhage of anus and rectum: Secondary | ICD-10-CM | POA: Diagnosis not present

## 2019-12-13 DIAGNOSIS — I10 Essential (primary) hypertension: Secondary | ICD-10-CM | POA: Diagnosis not present

## 2019-12-13 DIAGNOSIS — Z1211 Encounter for screening for malignant neoplasm of colon: Secondary | ICD-10-CM

## 2019-12-13 DIAGNOSIS — E1169 Type 2 diabetes mellitus with other specified complication: Secondary | ICD-10-CM | POA: Diagnosis not present

## 2019-12-13 DIAGNOSIS — D123 Benign neoplasm of transverse colon: Secondary | ICD-10-CM

## 2019-12-13 DIAGNOSIS — E119 Type 2 diabetes mellitus without complications: Secondary | ICD-10-CM | POA: Diagnosis not present

## 2019-12-13 DIAGNOSIS — K635 Polyp of colon: Secondary | ICD-10-CM | POA: Diagnosis not present

## 2019-12-13 DIAGNOSIS — Z85850 Personal history of malignant neoplasm of thyroid: Secondary | ICD-10-CM | POA: Diagnosis not present

## 2019-12-13 DIAGNOSIS — Z6841 Body Mass Index (BMI) 40.0 and over, adult: Secondary | ICD-10-CM | POA: Diagnosis not present

## 2019-12-13 DIAGNOSIS — K6289 Other specified diseases of anus and rectum: Secondary | ICD-10-CM

## 2019-12-13 DIAGNOSIS — E782 Mixed hyperlipidemia: Secondary | ICD-10-CM | POA: Insufficient documentation

## 2019-12-13 DIAGNOSIS — M1990 Unspecified osteoarthritis, unspecified site: Secondary | ICD-10-CM | POA: Diagnosis not present

## 2019-12-13 HISTORY — PX: COLONOSCOPY WITH PROPOFOL: SHX5780

## 2019-12-13 HISTORY — PX: POLYPECTOMY: SHX5525

## 2019-12-13 HISTORY — DX: Presence of dental prosthetic device (complete) (partial): Z97.2

## 2019-12-13 LAB — GLUCOSE, CAPILLARY: Glucose-Capillary: 126 mg/dL — ABNORMAL HIGH (ref 70–99)

## 2019-12-13 SURGERY — COLONOSCOPY WITH PROPOFOL
Anesthesia: Monitor Anesthesia Care

## 2019-12-13 MED ORDER — PROPOFOL 500 MG/50ML IV EMUL
INTRAVENOUS | Status: DC | PRN
  Administered 2019-12-13: 140 ug/kg/min via INTRAVENOUS

## 2019-12-13 MED ORDER — LIDOCAINE 2% (20 MG/ML) 5 ML SYRINGE
INTRAMUSCULAR | Status: DC | PRN
  Administered 2019-12-13: 50 mg via INTRAVENOUS

## 2019-12-13 MED ORDER — PROPOFOL 10 MG/ML IV BOLUS
INTRAVENOUS | Status: DC | PRN
  Administered 2019-12-13 (×2): 20 mg via INTRAVENOUS
  Administered 2019-12-13: 40 mg via INTRAVENOUS
  Administered 2019-12-13: 20 mg via INTRAVENOUS

## 2019-12-13 MED ORDER — LACTATED RINGERS IV SOLN: INTRAVENOUS | Status: DC

## 2019-12-13 MED ORDER — PROPOFOL 1000 MG/100ML IV EMUL
INTRAVENOUS | Status: AC
  Filled 2019-12-13: qty 100

## 2019-12-13 MED ORDER — SODIUM CHLORIDE 0.9 % IV SOLN: INTRAVENOUS | Status: DC

## 2019-12-13 SURGICAL SUPPLY — 22 items

## 2019-12-13 NOTE — Op Note (Signed)
[facility] Patient Name: Megan Moreno Procedure Date: 12/13/2019 MRN: [id number] Attending MD: Ladene Artist , MD Date of Birth: Jun 27, 1970 CSN: [id number] Age: 49 Admit Type: Outpatient Procedure:                Colonoscopy Indications:              Screening for colorectal malignant neoplasm Providers:                Pricilla Riffle. Rafuse Plan, MD, Benetta Spar RN, RN, Laverda Sorenson, Technician, Anne Fu CRNA, CRNA Referring MD:             Karie Fetch. Jonni Sanger MD Medicines:                Monitored Anesthesia Care Complications:            No immediate complications. Estimated blood loss:                            None. Estimated Blood Loss:     Estimated blood loss: none. Procedure:                Pre-Anesthesia Assessment:                           - Prior to the procedure, a History and Physical                            was performed, and patient medications and                            allergies were reviewed. The patient's tolerance of                            previous anesthesia was also reviewed. The risks                            and benefits of the procedure and the sedation                            options and risks were discussed with the patient.                            All questions were answered, and informed consent                            was obtained. Prior Anticoagulants: The patient has                            taken no previous anticoagulant or antiplatelet                            agents. ASA Grade Assessment: III - A patient with  severe systemic disease. After reviewing the risks                            and benefits, the patient was deemed in                            satisfactory condition to undergo the procedure.                           After obtaining informed consent, the colonoscope                            was passed under direct vision. Throughout the                             procedure, the patient's blood pressure, pulse, and                            oxygen saturations were monitored continuously. The                            CF-HQ190L ([id number] Olympus colonoscope was                            introduced through the anus and advanced to the the                            cecum, identified by appendiceal orifice and                            ileocecal valve. The ileocecal valve, appendiceal                            orifice, and rectum were photographed. The quality                            of the bowel preparation was adequate. The                            colonoscopy was performed without difficulty. The                            patient tolerated the procedure well. Scope In: 8:46:35 AM Scope Out: 9:04:17 AM Scope Withdrawal Time: 0 hours 16 minutes 22 seconds  Total Procedure Duration: 0 hours 17 minutes 42 seconds  Findings:      The perianal and digital rectal examinations were normal.      A 10 mm polyp was found in the transverse colon. The polyp was       semi-pedunculated. The polyp was removed with a cold snare. Resection       and retrieval were complete.      A 8 mm polyp was found in the transverse colon. The polyp was sessile.       The polyp was removed with a cold snare. Resection and retrieval were  complete.      The exam was otherwise without abnormality on direct and retroflexion       views. Impression:               - One 10 mm polyp in the transverse colon, removed                            with a cold snare. Resected and retrieved.                           - One 8 mm polyp in the transverse colon, removed                            with a cold snare. Resected and retrieved.                           - The examination was otherwise normal on direct                            and retroflexion views. Moderate Sedation:      Not Applicable - Patient had care per Anesthesia. Recommendation:            - Repeat colonoscopy after studies are complete for                            surveillance based on pathology results.                           - Patient has a contact number available for                            emergencies. The signs and symptoms of potential                            delayed complications were discussed with the                            patient. Return to normal activities tomorrow.                            Written discharge instructions were provided to the                            patient.                           - Resume previous diet.                           - Continue present medications.                           - Await pathology results.                           - No aspirin, ibuprofen, naproxen,  or other                            non-steroidal anti-inflammatory drugs for 2 weeks                            after polyp removal. Procedure Code(s):        --- Professional ---                           [phone], Colonoscopy, flexible; with removal of                            tumor(s), polyp(s), or other lesion(s) by snare                            technique Diagnosis Code(s):        --- Professional ---                           Z12.11, Encounter for screening for malignant                            neoplasm of colon                           K63.5, Polyp of colon CPT copyright 2019 American Medical Association. All rights reserved. The codes documented in this report are preliminary and upon coder review may  be revised to meet current compliance requirements. Ladene Artist, MD 12/13/2019 9:09:54 AM This report has been signed electronically. Number of Addenda: 0

## 2019-12-13 NOTE — Discharge Instructions (Signed)

## 2019-12-13 NOTE — Anesthesia Preprocedure Evaluation (Addendum)
Anesthesia Evaluation  Patient identified by MRN, date of birth, ID band Patient awake    Reviewed: Allergy & Precautions, NPO status , Patient's Chart, lab work & pertinent test results  Airway Mallampati: III  TM Distance: >3 FB Neck ROM: Full    Dental  (+) Teeth Intact, Dental Advisory Given   Pulmonary neg pulmonary ROS,    breath sounds clear to auscultation       Cardiovascular hypertension, Pt. on medications  Rhythm:Regular Rate:Normal     Neuro/Psych PSYCHIATRIC DISORDERS Anxiety Depression Bipolar Disorder negative neurological ROS     GI/Hepatic negative GI ROS, Neg liver ROS,   Endo/Other  diabetesHypothyroidism   Renal/GU Renal disease     Musculoskeletal  (+) Arthritis ,   Abdominal (+) + obese,   Peds  Hematology negative hematology ROS (+)   Anesthesia Other Findings   Reproductive/Obstetrics                           Anesthesia Physical Anesthesia Plan  ASA: IV  Anesthesia Plan: MAC   Post-op Pain Management:    Induction: Intravenous  PONV Risk Score and Plan: 0 and Propofol infusion  Airway Management Planned: Natural Airway and Simple Face Mask  Additional Equipment: None  Intra-op Plan:   Post-operative Plan:   Informed Consent: I have reviewed the patients History and Physical, chart, labs and discussed the procedure including the risks, benefits and alternatives for the proposed anesthesia with the patient or authorized representative who has indicated his/her understanding and acceptance.       Plan Discussed with: CRNA  Anesthesia Plan Comments:        Anesthesia Quick Evaluation

## 2019-12-13 NOTE — Transfer of Care (Signed)
Immediate Anesthesia Transfer of Care Note  Patient: Megan Moreno  Procedure(s) Performed: Procedure(s): COLONOSCOPY WITH PROPOFOL (N/A) POLYPECTOMY  Patient Location: PACU  Anesthesia Type:MAC  Level of Consciousness:  sedated, patient cooperative and responds to stimulation  Airway & Oxygen Therapy:Patient Spontanous Breathing and Patient connected to face mask oxgen  Post-op Assessment:  Report given to PACU RN and Post -op Vital signs reviewed and stable  Post vital signs:  Reviewed and stable  Last Vitals:  Vitals:   12/13/19 0720  BP: (!) 191/101  Pulse: 90  Resp: (!) 26  Temp: 36.6 C  SpO2: 60%    Complications: No apparent anesthesia complications

## 2019-12-13 NOTE — Anesthesia Postprocedure Evaluation (Signed)
Anesthesia Post Note  Patient: Megan Moreno  Procedure(s) Performed: COLONOSCOPY WITH PROPOFOL (N/A ) POLYPECTOMY     Patient location during evaluation: PACU Anesthesia Type: MAC Level of consciousness: awake and alert Pain management: pain level controlled Vital Signs Assessment: post-procedure vital signs reviewed and stable Respiratory status: spontaneous breathing, nonlabored ventilation, respiratory function stable and patient connected to nasal cannula oxygen Cardiovascular status: stable and blood pressure returned to baseline Postop Assessment: no apparent nausea or vomiting Anesthetic complications: no   No complications documented.  Last Vitals:  Vitals:   12/13/19 0920 12/13/19 0930  BP: (!) 141/81 (!) 169/90  Pulse: 84 82  Resp: 20 20  Temp:    SpO2: 99% 99%    Last Pain:  Vitals:   12/13/19 0930  TempSrc:   PainSc: 0-No pain                 Effie Berkshire

## 2019-12-13 NOTE — Interval H&P Note (Signed)
History and Physical Interval Note:  12/13/2019 8:34 AM  Megan Moreno  has presented today for surgery, with the diagnosis of screening, rectal bleeding.  The various methods of treatment have been discussed with the patient and family. After consideration of risks, benefits and other options for treatment, the patient has consented to  Procedure(s): COLONOSCOPY WITH PROPOFOL (N/A) as a surgical intervention.  The patient's history has been reviewed, patient examined, no change in status, stable for surgery.  I have reviewed the patient's chart and labs.  Questions were answered to the patient's satisfaction.     Pricilla Riffle. [place] Plan

## 2019-12-14 ENCOUNTER — Encounter (HOSPITAL_COMMUNITY): Payer: Self-pay | Admitting: Gastroenterology

## 2019-12-14 ENCOUNTER — Encounter: Payer: Self-pay | Admitting: Gastroenterology

## 2019-12-14 LAB — SURGICAL PATHOLOGY

## 2019-12-30 ENCOUNTER — Telehealth: Payer: Self-pay | Admitting: Family Medicine

## 2019-12-30 NOTE — Telephone Encounter (Signed)
Patient called in and stated she doesn't want to take [place] 2 MG/0.85ML AUIJ anymore that its leaving nodes everywhere plus she hates needles. Patient said she stopped 2 weeks ago. Patient said she would prefer to take pill even if it was for everyday.

## 2020-01-02 MED ORDER — TRULICITY 1.5 MG/0.5ML ~~LOC~~ SOAJ: 1.5000 mg | SUBCUTANEOUS | 11 refills | Status: AC

## 2020-01-02 NOTE — Telephone Encounter (Signed)
Patient aware that new meds sent to pharmacy

## 2020-01-02 NOTE — Telephone Encounter (Signed)
Will stop Bcise and change to trulicity. Same type of medication but shouldn't leave the nodules.   Ask her to try it.  Thanks.

## 2020-01-02 NOTE — Telephone Encounter (Signed)
FYI

## 2020-02-22 ENCOUNTER — Ambulatory Visit (INDEPENDENT_AMBULATORY_CARE_PROVIDER_SITE_OTHER): Payer: Medicare Other | Admitting: Family Medicine

## 2020-02-22 ENCOUNTER — Other Ambulatory Visit: Payer: Self-pay

## 2020-02-22 ENCOUNTER — Encounter: Payer: Self-pay | Admitting: Family Medicine

## 2020-02-22 VITALS — BP 134/79 | HR 79 | Temp 98.4°F | Ht 59.0 in | Wt 335.6 lb

## 2020-02-22 DIAGNOSIS — E890 Postprocedural hypothyroidism: Secondary | ICD-10-CM

## 2020-02-22 DIAGNOSIS — E1169 Type 2 diabetes mellitus with other specified complication: Secondary | ICD-10-CM

## 2020-02-22 DIAGNOSIS — D369 Benign neoplasm, unspecified site: Secondary | ICD-10-CM | POA: Insufficient documentation

## 2020-02-22 DIAGNOSIS — F319 Bipolar disorder, unspecified: Secondary | ICD-10-CM | POA: Diagnosis not present

## 2020-02-22 DIAGNOSIS — I152 Hypertension secondary to endocrine disorders: Secondary | ICD-10-CM

## 2020-02-22 DIAGNOSIS — E119 Type 2 diabetes mellitus without complications: Secondary | ICD-10-CM

## 2020-02-22 DIAGNOSIS — E1159 Type 2 diabetes mellitus with other circulatory complications: Secondary | ICD-10-CM | POA: Diagnosis not present

## 2020-02-22 DIAGNOSIS — E782 Mixed hyperlipidemia: Secondary | ICD-10-CM

## 2020-02-22 LAB — POCT GLYCOSYLATED HEMOGLOBIN (HGB A1C): Hemoglobin A1C: 6.9 % — AB (ref 4.0–5.6)
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
## 2020-02-22 NOTE — Patient Instructions (Signed)
Please return in 3-4 months for CPE and diabetes and HTN f/u.   Please take all of your medications as prescribed.  Your diabetes is controlled.   If you have any questions or concerns, please don't hesitate to send me a message via MyChart or call the office at [phone]. Thank you for visiting with [place] today! It's our pleasure caring for you.

## 2020-02-28 ENCOUNTER — Other Ambulatory Visit: Payer: Self-pay

## 2020-03-22 IMAGING — CT CT ANGIO CHEST
3 of 12 series · 18 of 38 positions shown · IV contrast (iopamidol)
Comparison: 04/28/2007

CLINICAL DATA: Chest pressure with dizziness.

EXAM:
CT ANGIOGRAPHY CHEST WITH CONTRAST
TECHNIQUE: Multidetector CT imaging of the chest was performed using the
standard protocol during bolus administration of intravenous
contrast. Multiplanar CT image reconstructions and MIPs were
obtained to evaluate the vascular anatomy.
CONTRAST:  100 mL 24077D-BA3 IOPAMIDOL (24077D-BA3) INJECTION 76%

[Series 5: pe 2mm · axial · 0.69mm/px · z∈[+1298,+1378]mm · 2 of 120 slices shown]
[im 40/120  lung]
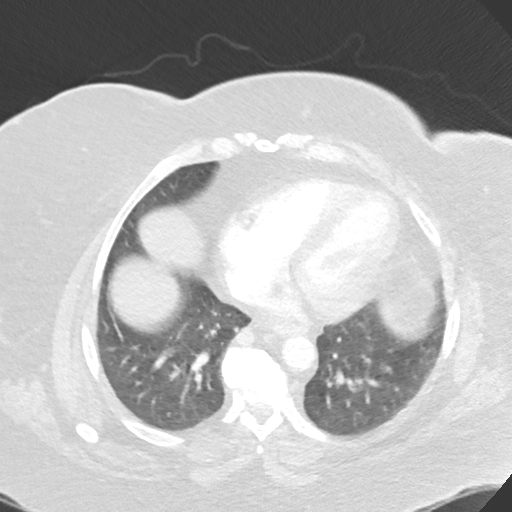
[im 80/120  lung]
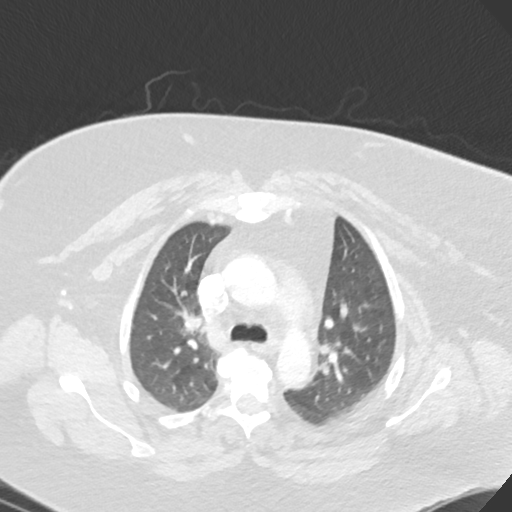

[Series 7: pe thins · axial · 0.69mm/px · z∈[+1246,+1431]mm · 8 of 341 slices shown (1 of 2)]
[im 38/341  lung]
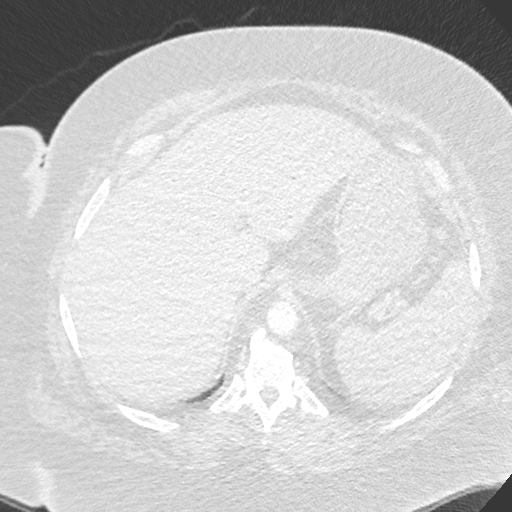
[im 76/341  mediastinal]
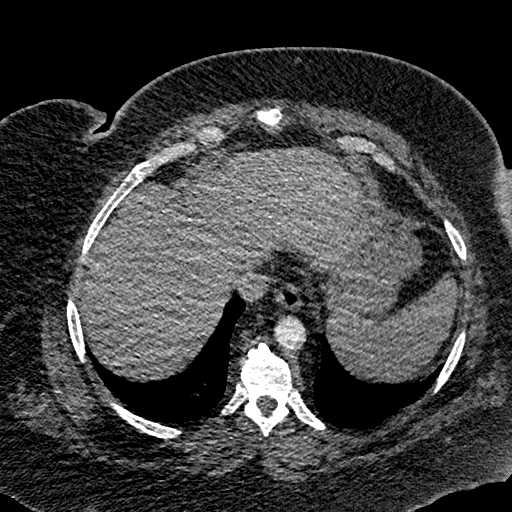
[im 114/341  lung]
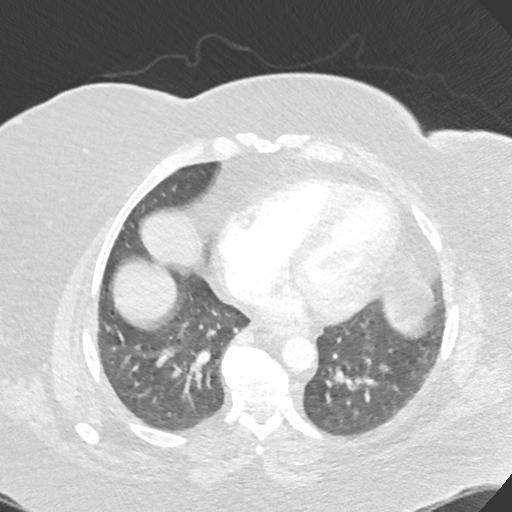
[im 152/341  mediastinal]
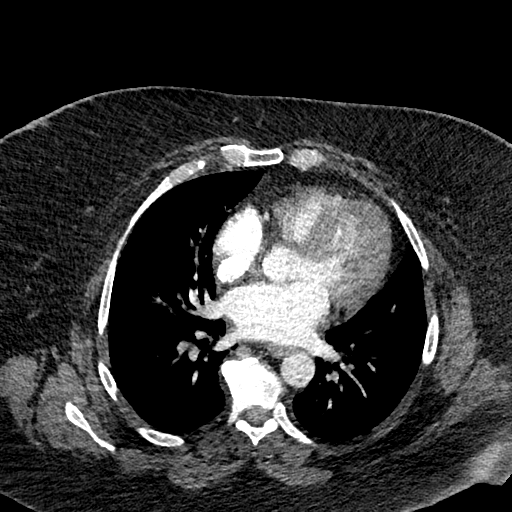
[im 189/341  lung]
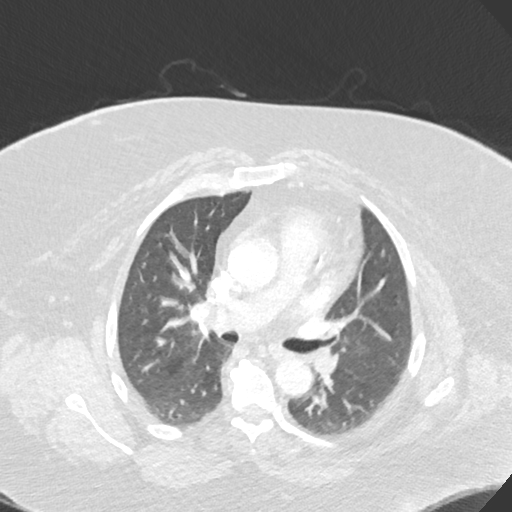
[im 227/341  mediastinal]
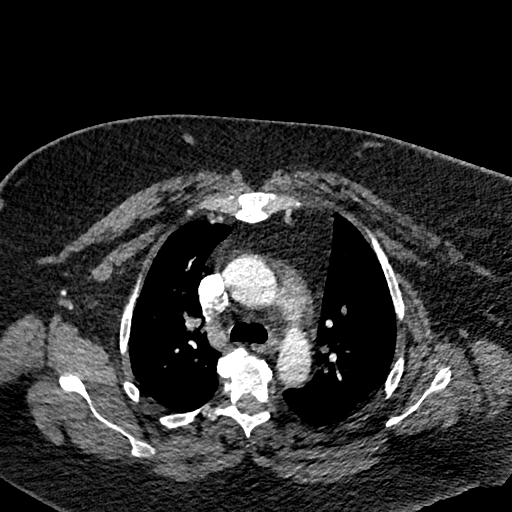
[im 265/341  lung]
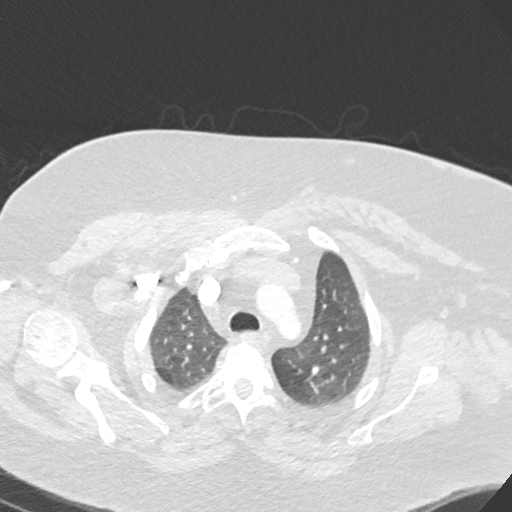
[im 303/341  mediastinal]
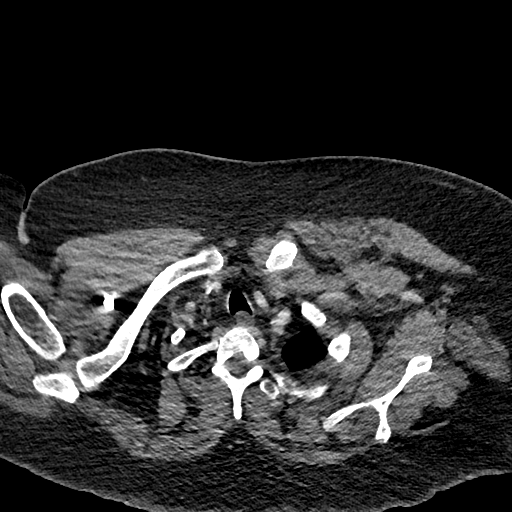

[Series 14: pe thins · axial · 0.69mm/px · z∈[+1246,+1431]mm · 8 of 341 slices shown (2 of 2)]
[im 38/341  lung]
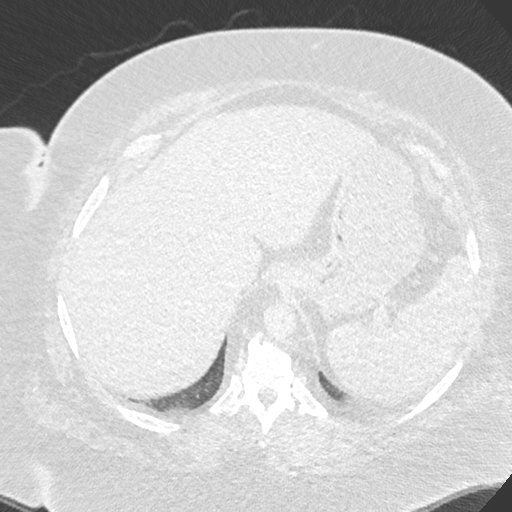
[im 76/341  lung]
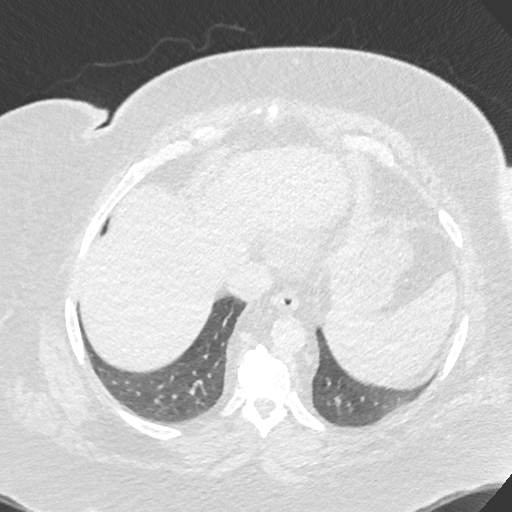
[im 114/341  lung]
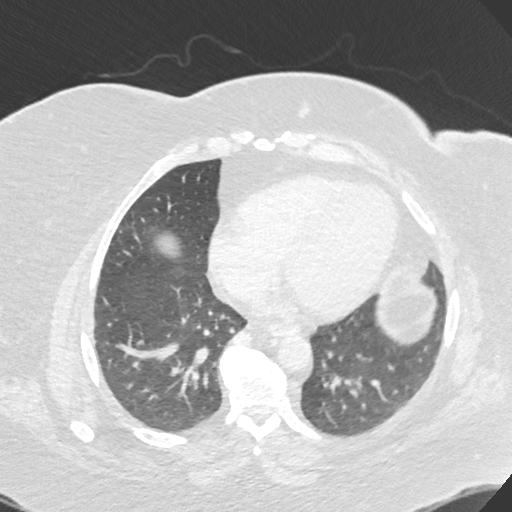
[im 152/341  lung]
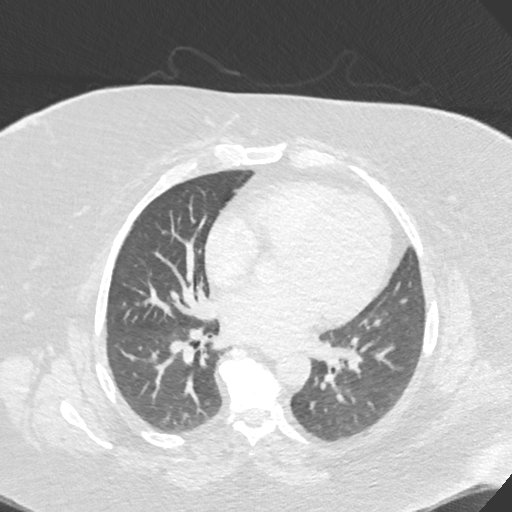
[im 189/341  lung]
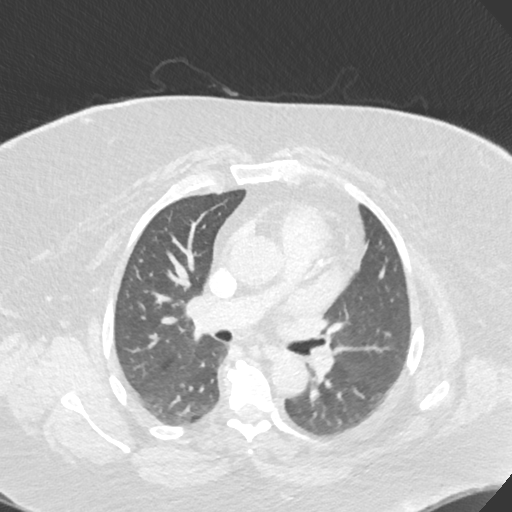
[im 227/341  lung]
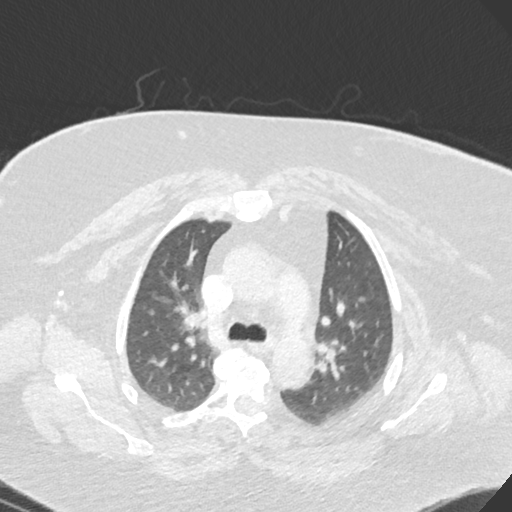
[im 265/341  lung]
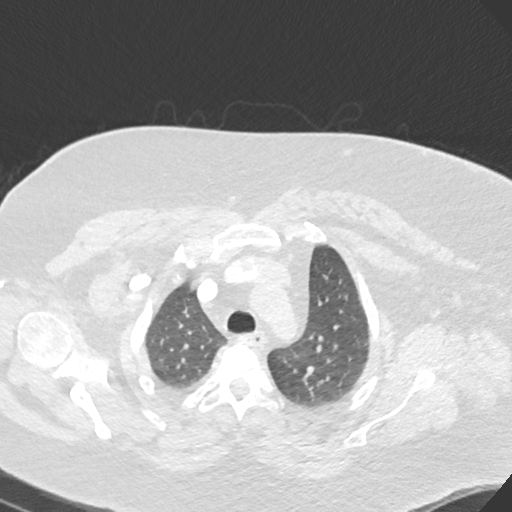
[im 303/341  lung]
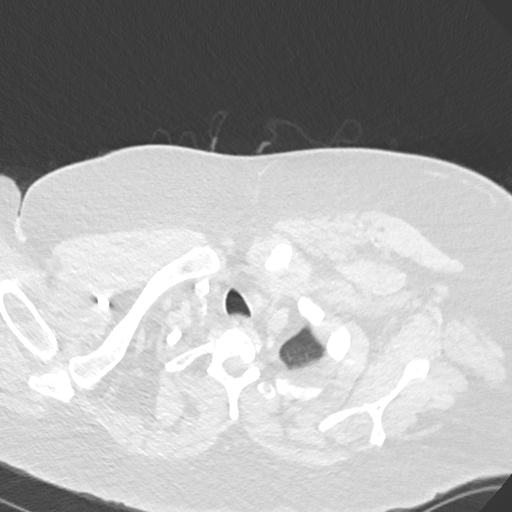

[18 of 38 positions shown; findings below may reference images not displayed]

FINDINGS: Cardiovascular: Heart is normal size. Subtle calcified plaque over
the left anterior descending coronary artery. Thoracic aorta is
within normal. Pulmonary arterial opacification is suboptimal as
scan was repeated with bolus tracking as [DATE] of images still
suboptimal. There are no emboli seen within the main pulmonary
arteries or proximal lobar arteries as it would be impossible
exclude more distal pulmonary emboli on this exam.

Mediastinum/Nodes: No evidence of mediastinal or hilar adenopathy.
There are a few small subcentimeter lymph nodes adjacent the mid to
distal esophagus.

Lungs/Pleura: Lungs are adequately inflated without consolidation or
effusion. Airways are normal.

Upper Abdomen: No acute findings.

Musculoskeletal: Degenerative change of the spine.

Review of the MIP images confirms the above findings.
IMPRESSION: Suboptimal opacification of the pulmonary arterial system despite
repeat imaging as there are no emboli seen over the main or proximal
lobar arteries. Exam is nondiagnostic for more distal pulmonary
arteries.

No acute cardiopulmonary disease.

Subtle atherosclerotic coronary artery disease.

## 2020-03-22 IMAGING — DX DG CHEST 2V
2 series · 2 of 2 positions shown · non-contrast
Comparison: 08/08/2009

CLINICAL DATA: Chest pressure

EXAM:
CHEST - 2 VIEW

[chest pa]
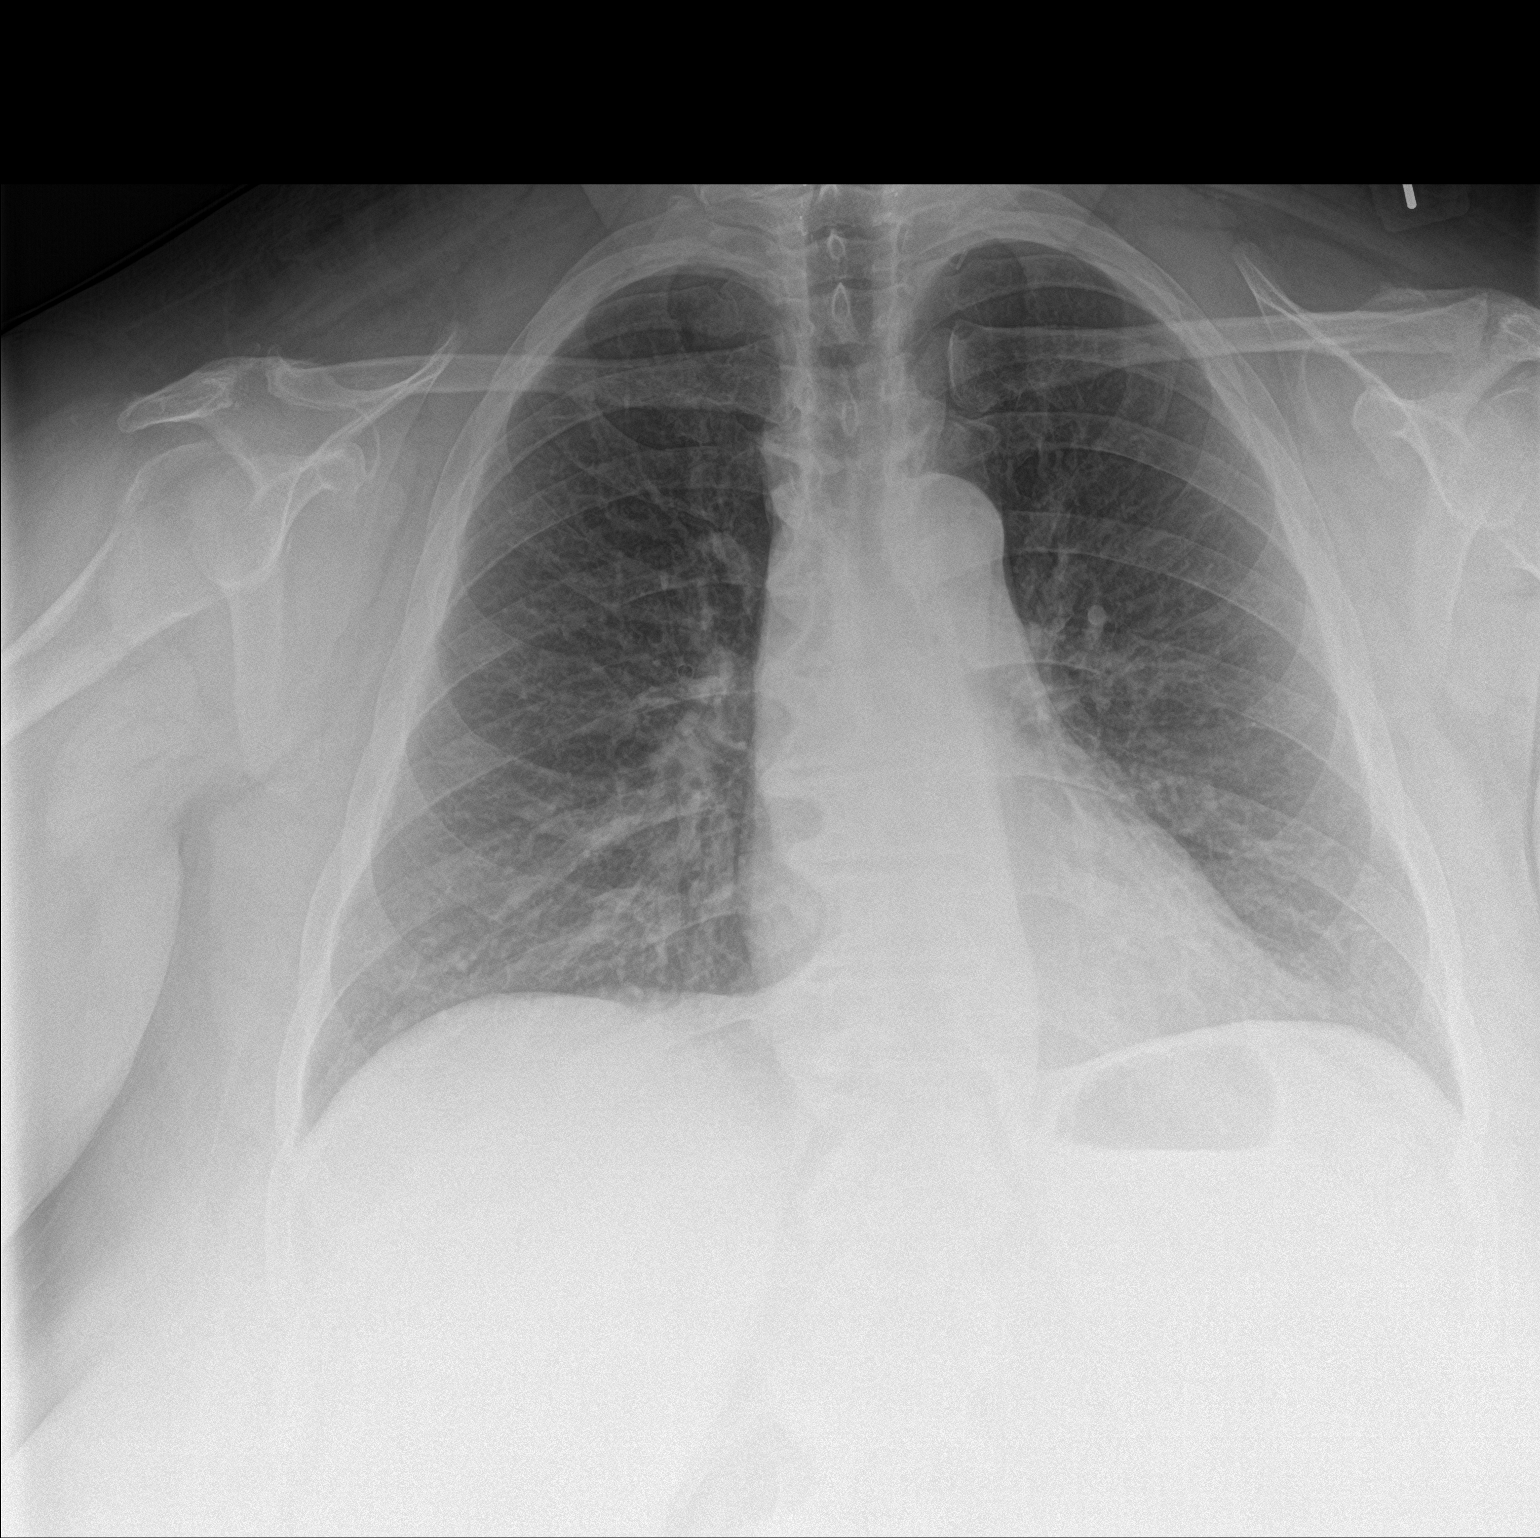

[chest lat]
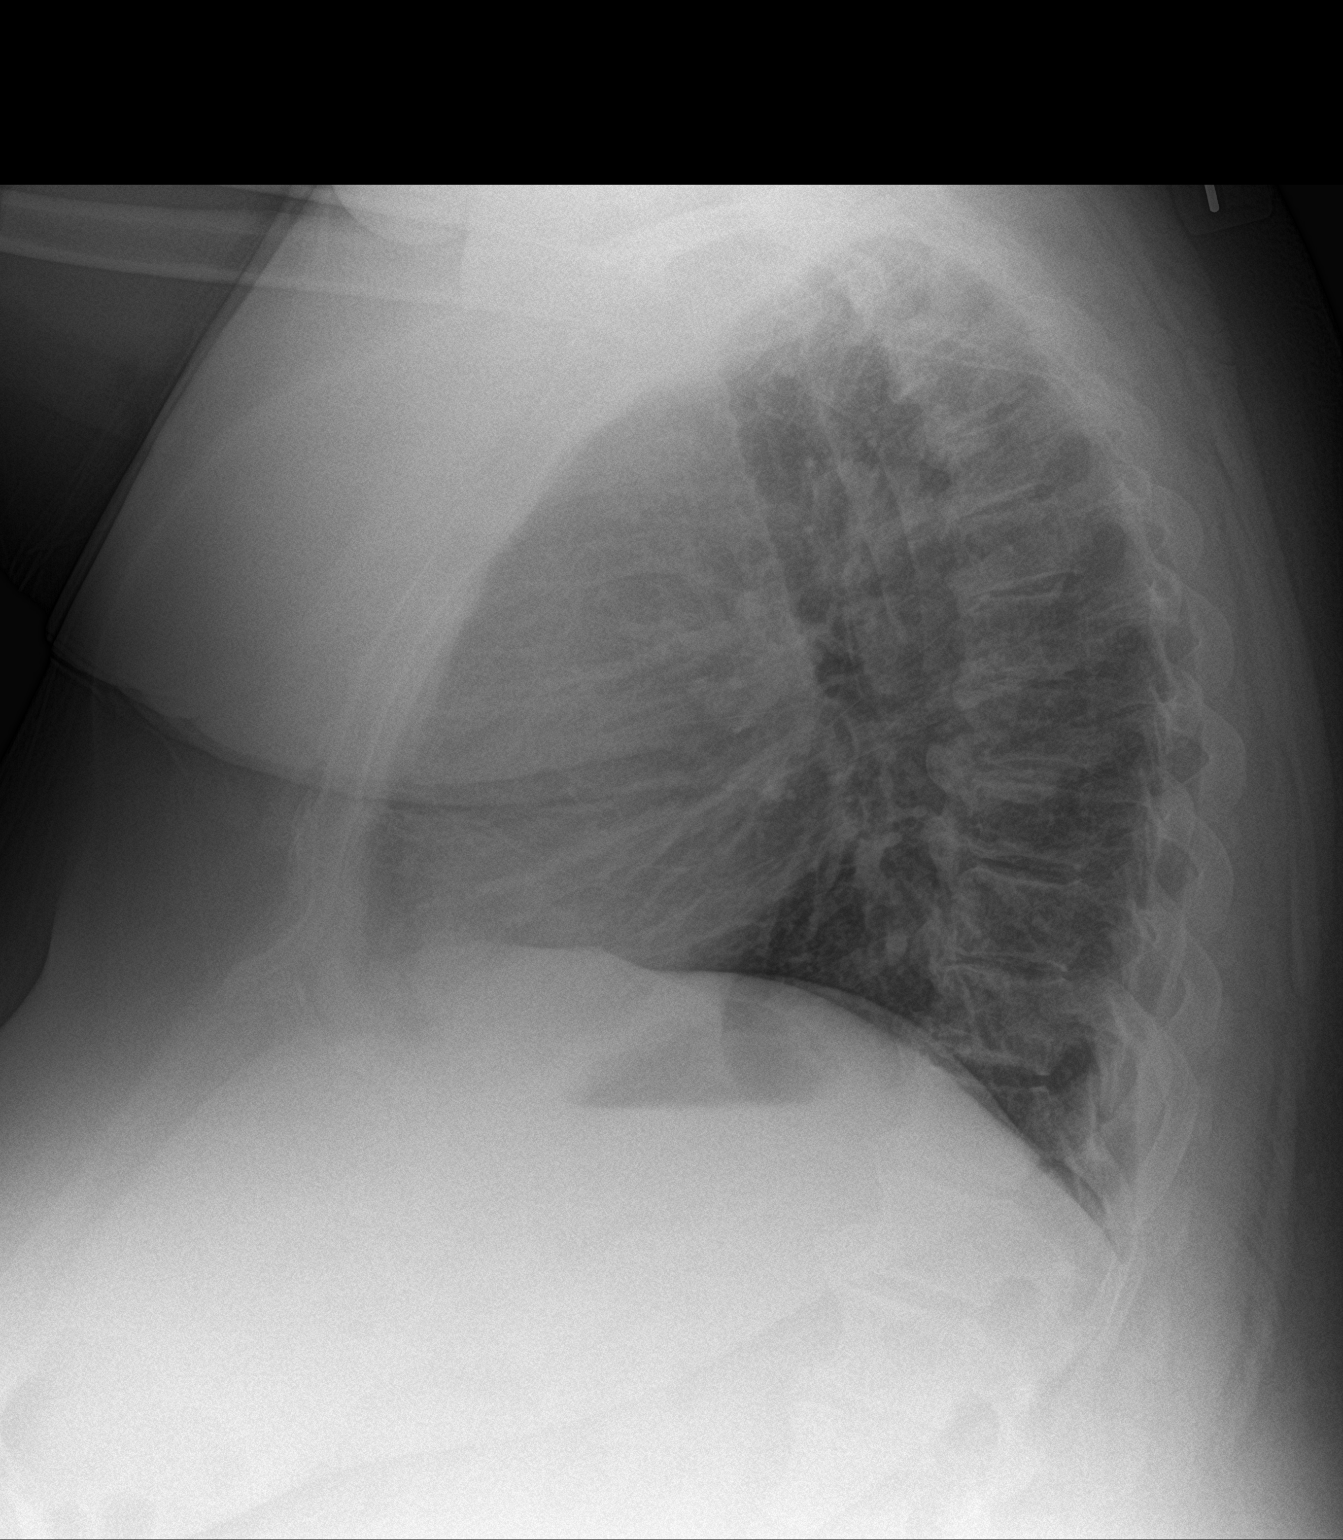

[2 of 2 positions shown; findings below may reference images not displayed]

FINDINGS: The heart size and mediastinal contours are within normal limits.
Both lungs are clear. Chronic degenerative change along the thoracic
spine.
IMPRESSION: No active cardiopulmonary disease. Thoracic spondylosis without
acute osseous abnormality.

## 2020-04-23 ENCOUNTER — Telehealth: Payer: Self-pay | Admitting: Family Medicine

## 2020-04-23 NOTE — Telephone Encounter (Signed)
Left message for patient to call back and schedule Medicare Annual Wellness Visit (AWV) either virtually OR in office.   Last AWV 02/01/19; please schedule at anytime with [facility]Nurse Health Advisor at [facility].  This should be a 45 minute visit.   

## 2020-05-09 ENCOUNTER — Other Ambulatory Visit: Payer: Self-pay | Admitting: Family Medicine

## 2020-06-08 ENCOUNTER — Encounter: Payer: Medicare Other | Admitting: Family Medicine

## 2020-06-15 DIAGNOSIS — H538 Other visual disturbances: Secondary | ICD-10-CM | POA: Diagnosis not present

## 2020-08-03 ENCOUNTER — Telehealth: Payer: Self-pay | Admitting: Family Medicine

## 2020-08-03 NOTE — Chronic Care Management (AMB) (Signed)
  Chronic Care Management   Outreach Note  08/03/2020 Name: Megan Moreno MRN: [id number] DOB: Jan 10, 1971  Referred by: Leamon Arnt, MD Reason for referral : No chief complaint on file.   An unsuccessful telephone outreach was attempted today. The patient was referred to the pharmacist for assistance with care management and care coordination.   Follow Up Plan:   Lauretta Grill Upstream Scheduler

## 2020-08-15 ENCOUNTER — Other Ambulatory Visit: Payer: Self-pay | Admitting: Family Medicine

## 2020-08-21 ENCOUNTER — Telehealth: Payer: Self-pay | Admitting: Family Medicine

## 2020-08-21 NOTE — Chronic Care Management (AMB) (Signed)
  Chronic Care Management   Note  08/21/2020 Name: [name] MRN: [id number] DOB: 19-May-1970  Megan Moreno is a 50 y.o. year old female who is a primary care patient of Leamon Arnt, MD. I reached out to Arnell Sieving by phone today in response to a referral sent by Ms. Chauncey Reading Simerson's PCP, Leamon Arnt, MD.   Ms. Pelly was given information about Chronic Care Management services today including:  1. CCM service includes personalized support from designated clinical staff supervised by her physician, including individualized plan of care and coordination with other care providers 2. 24/7 contact phone numbers for assistance for urgent and routine care needs. 3. Service will only be billed when office clinical staff spend 20 minutes or more in a month to coordinate care. 4. Only one practitioner may furnish and bill the service in a calendar month. 5. The patient may stop CCM services at any time (effective at the end of the month) by phone call to the office staff.   Patient did not agree to enrollment in care management services and does not wish to consider at this time.  Follow up plan:   Lauretta Grill Upstream Scheduler

## 2020-09-03 ENCOUNTER — Encounter: Payer: Medicare Other | Admitting: Family Medicine

## 2020-09-08 ENCOUNTER — Ambulatory Visit (HOSPITAL_COMMUNITY)
Admission: EM | Admit: 2020-09-08 | Discharge: 2020-09-08 | Disposition: A | Discharge status: Home / Self Care | Payer: Medicare Other | Attending: Internal Medicine | Admitting: Internal Medicine

## 2020-09-08 ENCOUNTER — Other Ambulatory Visit: Payer: Self-pay

## 2020-09-08 ENCOUNTER — Encounter (HOSPITAL_COMMUNITY): Payer: Self-pay

## 2020-09-08 DIAGNOSIS — R109 Unspecified abdominal pain: Secondary | ICD-10-CM | POA: Diagnosis not present

## 2020-09-08 DIAGNOSIS — R3129 Other microscopic hematuria: Secondary | ICD-10-CM

## 2020-09-08 DIAGNOSIS — Z87442 Personal history of urinary calculi: Secondary | ICD-10-CM | POA: Diagnosis not present

## 2020-09-08 LAB — POCT URINALYSIS DIPSTICK, ED / UC
Bilirubin Urine: NEGATIVE
Glucose, UA: NEGATIVE mg/dL
Ketones, ur: NEGATIVE mg/dL
Leukocytes,Ua: NEGATIVE
Nitrite: NEGATIVE
Protein, ur: NEGATIVE mg/dL
Specific Gravity, Urine: 1.01 (ref 1.005–1.030)
Urobilinogen, UA: 0.2 mg/dL (ref 0.0–1.0)
pH: 6.5 (ref 5.0–8.0)

## 2020-09-08 MED ORDER — CEPHALEXIN 500 MG PO CAPS: 500.0000 mg | ORAL_CAPSULE | Freq: Three times a day (TID) | ORAL | 0 refills | Status: DC

## 2020-09-08 MED ORDER — TAMSULOSIN HCL 0.4 MG PO CAPS: 0.4000 mg | ORAL_CAPSULE | Freq: Every day | ORAL | 0 refills | Status: DC

## 2020-09-08 NOTE — ED Triage Notes (Addendum)
Pt reports right flank pain when using the bathroom, and odor to her urine. Symptoms began about one week ago. Pt also reports having heartburn.

## 2020-09-08 NOTE — Discharge Instructions (Signed)
Please call your Urologist on Monday if you don't get better  I will have you try some pills to see if it can help you pass the stones you had.

## 2020-09-08 NOTE — ED Notes (Signed)
Pt states she is not able to provide urine sample at this time. Pt provided with ice water.
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
## 2020-09-08 NOTE — ED Notes (Signed)
Pt in restroom attempting to provide urine sample.

## 2020-09-10 LAB — URINE CULTURE: Culture: 80000 — AB

## 2020-09-19 ENCOUNTER — Telehealth: Payer: Self-pay

## 2020-09-19 NOTE — Telephone Encounter (Signed)
Needs ov if not better.

## 2020-09-19 NOTE — Telephone Encounter (Signed)
Please get patient scheduled for an office visit if still having pain.

## 2020-09-19 NOTE — Telephone Encounter (Signed)
Please advise 

## 2020-09-19 NOTE — Telephone Encounter (Signed)
Patient is requesting to do a kidney recheck,  She was seen at urgent care on 5/21 and her results are in chart and shes still in pain can we put orders in to get urine rechecked.   PLEASE ADVISE

## 2020-09-25 ENCOUNTER — Other Ambulatory Visit: Payer: Self-pay

## 2020-09-25 ENCOUNTER — Encounter (HOSPITAL_COMMUNITY): Payer: Self-pay

## 2020-09-25 ENCOUNTER — Ambulatory Visit (HOSPITAL_COMMUNITY)
Admission: EM | Admit: 2020-09-25 | Discharge: 2020-09-25 | Disposition: A | Discharge status: Home / Self Care | Payer: Medicare Other | Attending: Internal Medicine | Admitting: Internal Medicine

## 2020-09-25 DIAGNOSIS — R102 Pelvic and perineal pain: Secondary | ICD-10-CM | POA: Insufficient documentation

## 2020-09-25 DIAGNOSIS — N898 Other specified noninflammatory disorders of vagina: Secondary | ICD-10-CM | POA: Diagnosis present

## 2020-09-25 DIAGNOSIS — Z79899 Other long term (current) drug therapy: Secondary | ICD-10-CM | POA: Diagnosis not present

## 2020-09-25 DIAGNOSIS — Z885 Allergy status to narcotic agent status: Secondary | ICD-10-CM | POA: Insufficient documentation

## 2020-09-25 LAB — POCT URINALYSIS DIPSTICK, ED / UC
Bilirubin Urine: NEGATIVE
Glucose, UA: NEGATIVE mg/dL
Ketones, ur: NEGATIVE mg/dL
Nitrite: NEGATIVE
Protein, ur: NEGATIVE mg/dL
Specific Gravity, Urine: 1.01 (ref 1.005–1.030)
Urobilinogen, UA: 0.2 mg/dL (ref 0.0–1.0)
pH: 6.5 (ref 5.0–8.0)

## 2020-09-25 MED ORDER — FLUCONAZOLE 150 MG PO TABS: 150.0000 mg | ORAL_TABLET | ORAL | 0 refills | Status: DC
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
## 2020-09-25 NOTE — ED Triage Notes (Signed)
Pt reports passing kidney stone about 2 weeks ago. Since then pt has been reporting pain in right kidney and pain/swelling in vaginal area. Pt reports fishy smelling urine.  Pt reports was recently seen here and put on antibiotics.

## 2020-09-26 LAB — CERVICOVAGINAL ANCILLARY ONLY
Bacterial Vaginitis (gardnerella): NEGATIVE
Candida Glabrata: POSITIVE — AB
Candida Vaginitis: POSITIVE — AB
Comment: NEGATIVE
Comment: NEGATIVE
Comment: NEGATIVE

## 2020-09-27 LAB — URINE CULTURE: Culture: <10000 — AB

## 2020-11-15 ENCOUNTER — Other Ambulatory Visit: Payer: Self-pay | Admitting: Family Medicine

## 2020-11-28 ENCOUNTER — Telehealth: Payer: Self-pay

## 2020-11-28 NOTE — Telephone Encounter (Signed)
Can offer same day sometime this week within [facility]

## 2020-11-28 NOTE — Telephone Encounter (Signed)
LVM asking to call back to get scheduled for an appointment.

## 2020-11-28 NOTE — Telephone Encounter (Signed)
Nurse Assessment Nurse: Linward Headland, RN, [place] Date/Time (Eastern Time): 11/27/2020 4:34:46 PM Confirm and document reason for call. If symptomatic, describe symptoms. ---Caller states she is having bloody urine and pain in lower back. States she has a history of kidney issues, symptoms started Saturday. States she is having to wear a pad due to incontinence, leaking urine, states she has a hx of overactive bladder. Current pain level 5/10, states the pain comes and goes. States it stings sometimes when she urinates. States Saturday urine was pink, and had some blood clots, got lighter as the day when on. Went to urgent care a month ago for similar symptoms. Does the patient have any new or worsening symptoms? ---Yes Will a triage be completed? ---Yes Related visit to physician within the last 2 weeks? ---No Does the PT have any chronic conditions? (i.e. diabetes, asthma, this includes High risk factors for pregnancy, etc.) ---Yes List chronic conditions. ---kidney issues, total thyroidectomy Is the patient pregnant or possibly pregnant? (Ask all females between the ages of 57-55) ---No Is this a behavioral health or substance abuse call? ---No PLEASE NOTE: All timestamps contained within this report are represented as [place] Standard Time. CONFIDENTIALTY NOTICE: This fax transmission is intended only for the addressee. It contains information that is legally privileged, confidential or otherwise protected from use or disclosure. If you are not the intended recipient, you are strictly prohibited from reviewing, disclosing, copying using or disseminating any of this information or taking any action in reliance on or regarding this information. If you have received this fax in error, please notify us immediately by telephone so that we can arrange for its return to [place]. Phone: [phone], Toll-Free: [phone], Fax: [phone] Page: 2 of 2 Call Id: [id number] Guidelines Guideline Title  Affirmed Question Affirmed Notes Nurse Date/Time Eilene Ghazi Time) Urine - Blood In Passing pure blood or large blood clots (i.e., size > a dime) (Exception: fleck or small strands) Linward Headland, RN, [facility] 11/27/2020 4:39:32 PM Disp. Time Eilene Ghazi Time) Disposition Final User 11/27/2020 4:53:54 PM Go to ED Now Yes Linward Headland, RN, Norm[facility]rickson Disagree/Comply Disagree Caller Understands Yes PreDisposition Did not know what to do Care Advice Given Per Guideline GO TO ED NOW: * You need to be seen in the Emergency Department. ANOTHER ADULT SHOULD DRIVE: * It is better and safer if another adult drives instead of you. CARE ADVICE given per Urine, Blood In (Adult) guideline. Comments User: Jodelle Green, RN Date/Time Eilene Ghazi Time): 11/27/2020 4:39:55 PM was put on abx a month ago when she went to UC User: Jodelle Green, RN Date/Time (Eastern Time): 11/27/2020 4:44:43 PM Unsure about any fever, possibly Saturday because she felt cold, today urine has been light yellow, earlier was dark yellow. Referrals GO TO FACILITY REFUSE

## 2020-11-28 NOTE — Telephone Encounter (Signed)
Offered an appointment today at 3. Patient states she will have to check with her dad to see if he can take her and will call back once he is awake. Holding appointment on Megan Moreno's schedule until 12.

## 2020-11-28 NOTE — Telephone Encounter (Signed)
Patient is unable to make appointment, no available appointments within 24 hours. Please advise on where to put patient.

## 2020-11-28 NOTE — Telephone Encounter (Signed)
Please call patient and offer appt

## 2020-12-03 ENCOUNTER — Encounter: Payer: Self-pay | Admitting: Physician Assistant

## 2020-12-03 ENCOUNTER — Ambulatory Visit (INDEPENDENT_AMBULATORY_CARE_PROVIDER_SITE_OTHER): Payer: Medicare Other | Admitting: Physician Assistant

## 2020-12-03 ENCOUNTER — Other Ambulatory Visit: Payer: Self-pay

## 2020-12-03 VITALS — BP 129/84 | HR 109 | Temp 98.0°F | Ht 59.0 in | Wt 335.2 lb

## 2020-12-03 DIAGNOSIS — R319 Hematuria, unspecified: Secondary | ICD-10-CM

## 2020-12-03 DIAGNOSIS — N200 Calculus of kidney: Secondary | ICD-10-CM

## 2020-12-03 LAB — POC URINALSYSI DIPSTICK (AUTOMATED)
Bilirubin, UA: NEGATIVE
Glucose, UA: NEGATIVE
Ketones, UA: NEGATIVE
Leukocytes, UA: NEGATIVE
Nitrite, UA: NEGATIVE
Protein, UA: NEGATIVE
Spec Grav, UA: 1.01 (ref 1.010–1.025)
Urobilinogen, UA: 0.2 E.U./dL
pH, UA: 6.5 (ref 5.0–8.0)

## 2020-12-03 NOTE — Progress Notes (Signed)
Twice  Acute Office Visit  Subjective:    Patient ID: Megan Moreno, female    DOB: 03-Jan-1971, 50 y.o.   MRN: [id number]  Chief Complaint  Patient presents with   Hematuria   Urinary Retention    Hematuria  Patient is in today for urinary retention and hematuria off and on for the last few months.  She has been to the urgent care in the last few months, once on 09/08/2020 and then again on 09/25/2020.  She was told she might have urinary infection and was given cephalexin, without much relief.  She was then treated for possible yeast infection with Diflucan.  She says that 2 weekends ago she had an episode of bright red blood with urination and flank pain.  She says that things still do not feel right, feeling like she is still not urinating as much as she used to. She is having lower back pain that radiates to her lower abdomen on both sides. Denies anymore visible blood in her urine. She denies any fever or chills.  No nausea or vomiting.  No severe pain.  She was told at one point that she had kidney stones.  Past Medical History:  Diagnosis Date   Ambulates with cane    Anxiety    Arthritis    Bipolar disorder ([place]) 12/09/2018   Cataract    bilateral - MD just watching now   Controlled type 2 diabetes mellitus without complication, without long-term current use of insulin ([place]) 12/09/2018   Depression    History of kidney stones    Hyperlipidemia    Hypertension    Hypoglycemia    Morbid obesity ([place]) 12/09/2018   Nephrolithiasis 12/09/2018   OAB (overactive bladder)    Obesity    Post-operative hypothyroidism 12/09/2018   Thyroid cancer ([place])    Wears partial dentures    upper    Past Surgical History:  Procedure Laterality Date   COLONOSCOPY WITH PROPOFOL N/A 12/13/2019   Procedure: COLONOSCOPY WITH PROPOFOL;  Surgeon: Ladene Artist, MD;  Location: [facility];  Service: Endoscopy;  Laterality: N/A;   ESOPHAGOGASTRODUODENOSCOPY (EGD) WITH PROPOFOL N/A 12/23/2016    Procedure: ESOPHAGOGASTRODUODENOSCOPY (EGD) WITH PROPOFOL;  Surgeon: Ladene Artist, MD;  Location: [facility];  Service: Endoscopy;  Laterality: N/A;   HEEL SPUR SURGERY Left    LITHOTRIPSY     PARTIAL HYSTERECTOMY     POLYPECTOMY  12/13/2019   Procedure: POLYPECTOMY;  Surgeon: Ladene Artist, MD;  Location: [facility];  Service: Endoscopy;;   rotator cuff surgery Bilateral    THYROIDECTOMY     TONSILLECTOMY AND ADENOIDECTOMY     and adenoidectomy     Family History  Problem Relation Age of Onset   Ovarian cancer Mother    Colon cancer Neg Hx    Esophageal cancer Neg Hx    Pancreatic cancer Neg Hx    Stomach cancer Neg Hx    Liver disease Neg Hx    Thyroid cancer Neg Hx    Thyroid disease Neg Hx    Rectal cancer Neg Hx     Social History   Socioeconomic History   Marital status: Single    Spouse name: Not on file   Number of children: Not on file   Years of education: Not on file   Highest education level: Not on file  Occupational History   Not on file  Tobacco Use   Smoking status: Never   Smokeless tobacco: Never  Vaping Use  Vaping Use: Never used  Substance and Sexual Activity   Alcohol use: Yes    Comment: occasionally   Drug use: No   Sexual activity: Never    Birth control/protection: Surgical    Comment: Hysterectomy  Other Topics Concern   Not on file  Social History Narrative   Not on file   Social Determinants of Health   Financial Resource Strain: Not on file  Food Insecurity: Not on file  Transportation Needs: Not on file  Physical Activity: Not on file  Stress: Not on file  Social Connections: Not on file  Intimate Partner Violence: Not on file    Outpatient Medications Prior to Visit  Medication Sig Dispense Refill   acetaminophen (TYLENOL) 500 MG tablet Take 500 mg by mouth every 6 (six) hours as needed for mild pain, moderate pain or headache.     atorvastatin (LIPITOR) 40 MG tablet TAKE 1 TABLET BY MOUTH AT BEDTIME 90  tablet 0   Blood Glucose Monitoring Suppl (ONE TOUCH ULTRA 2) w/Device KIT Test blood sugar one to two times daily 1 kit 0   Calcium Carbonate-Vitamin D 600-400 MG-UNIT tablet Take 1 tablet by mouth daily.      cephALEXin (KEFLEX) 500 MG capsule Take 1 capsule (500 mg total) by mouth 3 (three) times daily. 30 capsule 0   Coenzyme Q10 10 MG capsule Take 10 mg by mouth daily.     diltiazem (CARDIZEM CD) 120 MG 24 hr capsule Take 1 capsule (120 mg total) by mouth daily. 90 capsule 3   diphenhydrAMINE (BENADRYL) 50 MG capsule Take 50 mg by mouth every 6 (six) hours as needed for allergies.      EUTHYROX 112 MCG tablet TAKE 2 TABLETS BY MOUTH ONCE DAILY BEFORE BREAKFAST 180 tablet 0   fluconazole (DIFLUCAN) 150 MG tablet Take 1 tablet (150 mg total) by mouth every other day. 3 tablet 0   glucose blood test strip Check blood sugar one - two times daily 100 each 12   Lancets (ONETOUCH ULTRASOFT) lancets Check blood sugar one - two times daily 100 each 12   LITHOBID 300 MG CR tablet Take 600 mg by mouth at bedtime.      LORazepam (ATIVAN) 0.5 MG tablet Take 1 tablet by mouth 2 (two) times daily as needed for anxiety.      OVER THE COUNTER MEDICATION Take 1 capsule by mouth daily. VH - hot flashes     prazosin (MINIPRESS) 2 MG capsule Take 2 mg by mouth at bedtime.     Pumpkin Seed-Soy Germ (AZO BLADDER CONTROL/GO-LESS PO) Take 1 capsule by mouth daily.     tamsulosin (FLOMAX) 0.4 MG CAPS capsule Take 1 capsule (0.4 mg total) by mouth daily. To help pass kidney stone 10 capsule 0   No facility-administered medications prior to visit.    Allergies  Allergen Reactions   Myrbetriq [Mirabegron] Anaphylaxis and Itching   Morphine And Related Nausea And Vomiting   Omeprazole     Stomach pain    Review of Systems  Genitourinary:  Positive for hematuria.  REFER TO HPI FOR PERTINENT POSITIVES AND NEGATIVES     Objective:    Physical Exam Vitals and nursing note reviewed.  Constitutional:       General: She is not in acute distress.    Appearance: Normal appearance. She is obese. She is not toxic-appearing.  HENT:     Head: Normocephalic.  Cardiovascular:     Rate and Rhythm: Regular rhythm. Tachycardia  present.     Heart sounds: Normal heart sounds.  Pulmonary:     Effort: Pulmonary effort is normal.     Breath sounds: Normal breath sounds.  Abdominal:     General: Abdomen is flat. Bowel sounds are normal.     Palpations: Abdomen is soft.     Tenderness: There is no abdominal tenderness. There is no right CVA tenderness, left CVA tenderness, guarding or rebound.  Neurological:     Mental Status: She is alert.  Psychiatric:        Mood and Affect: Mood normal.        Behavior: Behavior normal.        Thought Content: Thought content normal.        Judgment: Judgment normal.    BP 129/84   Pulse (!) 109   Temp 98 F (36.7 C)   Ht 4' 11" (1.499 m)   Wt (!) 335 lb 3.2 oz (152 kg)   LMP  (LMP Unknown)   SpO2 98%   BMI 67.70 kg/m  Wt Readings from Last 3 Encounters:  12/03/20 (!) 335 lb 3.2 oz (152 kg)  02/22/20 (!) 335 lb 9.6 oz (152.2 kg)  12/13/19 (!) 350 lb 1.5 oz (158.8 kg)    Health Maintenance Due  Topic Date Due   Hepatitis C Screening  Never done   Zoster Vaccines- Shingrix (1 of 2) Never done   COVID-19 Vaccine (2 - Janssen risk series) 09/18/2019   Pneumococcal Vaccine 63-49 Years old (3 - PCV) 05/12/2020   OPHTHALMOLOGY EXAM  06/09/2020   MAMMOGRAM  08/10/2020   HEMOGLOBIN A1C  08/21/2020   INFLUENZA VACCINE  11/19/2020    There are no preventive care reminders to display for this patient.   Lab Results  Component Value Date   TSH 1.13 11/21/2019   Lab Results  Component Value Date   WBC 8.7 05/13/2019   HGB 14.9 05/13/2019   HCT 44.3 05/13/2019   MCV 94.1 05/13/2019   PLT 225 05/13/2019   Lab Results  Component Value Date   NA 140 11/21/2019   K 4.1 11/21/2019   CO2 28 11/21/2019   GLUCOSE 139 (H) 11/21/2019   BUN 11 11/21/2019    CREATININE 0.89 11/21/2019   BILITOT 0.5 11/21/2019   ALKPHOS 93 12/09/2018   AST 20 11/21/2019   ALT 26 11/21/2019   PROT 7.0 11/21/2019   ALBUMIN 3.9 12/09/2018   CALCIUM 9.7 11/21/2019   ANIONGAP 11 10/21/2018   GFR 62.91 12/09/2018   Lab Results  Component Value Date   CHOL 143 11/21/2019   Lab Results  Component Value Date   HDL 55 11/21/2019   Lab Results  Component Value Date   LDLCALC 65 11/21/2019   Lab Results  Component Value Date   TRIG 144 11/21/2019   Lab Results  Component Value Date   CHOLHDL 2.6 11/21/2019   Lab Results  Component Value Date   HGBA1C 6.9 (A) 02/22/2020       Assessment & Plan:   Problem List Items Addressed This Visit   None Visit Diagnoses     Hematuria, unspecified type    -  Primary   Relevant Orders   POCT Urinalysis Dipstick (Automated)   Urine Culture      1. Hematuria, unspecified type 2. Nephrolithiasis 3. Morbid obesity ([place]) Urinalysis I personally reviewed both prior urgent care visits with the patient.  Her urinalysis showed positive for microscopic blood, but the rest of the UA  looked okay.  We will still send off for urine culture.  Also will check CBC and CMP.  I reviewed her imaging reports and did not see any recent ultrasounds or CT scans in the last 5 years that would indicate renal stones.  As she is having new issues, I am going to order a CT renal stone study to see if this can shed any light on her situation.  Considered just an ultrasound, but given her body habitus this would probably be limited.  She also understands she may need to see urology.  As results come back I will communicate with her about plan moving forward.  She also knows to go to the emergency department in the event that she has severe acute pain or if she stops urinating altogether.  I encouraged her to keep pushing fluids at this time.  This note was prepared with assistance of Systems analyst. Occasional  wrong-word or sound-a-like substitutions may have occurred due to the inherent limitations of voice recognition software.   Kizzie Cotten M Allice Garro, PA-C

## 2020-12-03 NOTE — Patient Instructions (Signed)
Good to meet you today! Please go to the lab for blood work and I will send results through [place]. Your urine is still showing microscopic blood. I am going to send for culture and also send order for CT renal stone. Continue to push fluids. Tylenol for pain OK. IF you stop urinating altogether or severe pain develops, you need to go to the ED.

## 2020-12-04 ENCOUNTER — Telehealth: Payer: Self-pay | Admitting: Family Medicine

## 2020-12-04 LAB — COMPREHENSIVE METABOLIC PANEL
ALT: 26 U/L (ref 0–35)
AST: 31 U/L (ref 0–37)
Albumin: 3.8 g/dL (ref 3.5–5.2)
Alkaline Phosphatase: 87 U/L (ref 39–117)
BUN: 11 mg/dL (ref 6–23)
CO2: 26 mEq/L (ref 19–32)
Calcium: 9.8 mg/dL (ref 8.4–10.5)
Chloride: 101 mEq/L (ref 96–112)
Creatinine, Ser: 0.87 mg/dL (ref 0.40–1.20)
GFR: 76.58 mL/min (ref >60.00)
Glucose, Bld: 149 mg/dL — ABNORMAL HIGH (ref 70–99)
Potassium: 3.9 mEq/L (ref 3.5–5.1)
Sodium: 137 mEq/L (ref 135–145)
Total Bilirubin: 0.7 mg/dL (ref 0.2–1.2)
Total Protein: 8.5 g/dL — ABNORMAL HIGH (ref 6.0–8.3)

## 2020-12-04 LAB — CBC WITH DIFFERENTIAL/PLATELET
Basophils Absolute: 0.1 10*3/uL (ref 0.0–0.1)
Basophils Relative: 0.6 % (ref 0.0–3.0)
Eosinophils Absolute: 0.1 10*3/uL (ref 0.0–0.7)
Eosinophils Relative: 1.2 % (ref 0.0–5.0)
HCT: 44.5 % (ref 36.0–46.0)
Hemoglobin: 14.7 g/dL (ref 12.0–15.0)
Lymphocytes Relative: 14.3 % (ref 12.0–46.0)
Lymphs Abs: 1.5 10*3/uL (ref 0.7–4.0)
MCHC: 33.1 g/dL (ref 30.0–36.0)
MCV: 97.1 fl (ref 78.0–100.0)
Monocytes Absolute: 0.7 10*3/uL (ref 0.1–1.0)
Monocytes Relative: 6.1 % (ref 3.0–12.0)
Neutro Abs: 8.4 10*3/uL — ABNORMAL HIGH (ref 1.4–7.7)
Neutrophils Relative %: 77.8 % — ABNORMAL HIGH (ref 43.0–77.0)
Platelets: 255 10*3/uL (ref 150.0–400.0)
RBC: 4.58 Mil/uL (ref 3.87–5.11)
RDW: 14.1 % (ref 11.5–15.5)
WBC: 10.8 10*3/uL — ABNORMAL HIGH (ref 4.0–10.5)

## 2020-12-04 NOTE — Telephone Encounter (Signed)
Copied from [place] [phone]. Topic: Medicare AWV >> Dec 04, 2020 10:44 AM Harris-Coley, Hannah Beat wrote: Reason for CRM: Left message for patient to schedule Annual Wellness Visit.  Please schedule with Nurse Health Advisor Charlott Rakes, RN at [facility].  Please call [phone] ask for [facility]

## 2020-12-05 LAB — URINE CULTURE
MICRO NUMBER:: 12243286
SPECIMEN QUALITY:: ADEQUATE

## 2020-12-21 ENCOUNTER — Ambulatory Visit
Admission: RE | Admit: 2020-12-21 | Discharge: 2020-12-21 | Disposition: A | Discharge status: Home / Self Care | Payer: Medicare Other | Source: Ambulatory Visit | Attending: Physician Assistant | Admitting: Physician Assistant

## 2020-12-21 ENCOUNTER — Other Ambulatory Visit: Payer: Self-pay

## 2020-12-21 DIAGNOSIS — R319 Hematuria, unspecified: Secondary | ICD-10-CM

## 2020-12-21 DIAGNOSIS — N200 Calculus of kidney: Secondary | ICD-10-CM

## 2020-12-21 DIAGNOSIS — I700 Atherosclerosis of aorta: Secondary | ICD-10-CM | POA: Diagnosis not present

## 2021-01-02 ENCOUNTER — Encounter: Payer: Medicare Other | Admitting: Family Medicine

## 2021-01-16 ENCOUNTER — Encounter: Payer: Medicare Other | Admitting: Family Medicine

## 2021-02-15 ENCOUNTER — Other Ambulatory Visit: Payer: Self-pay | Admitting: Family Medicine

## 2021-02-18 ENCOUNTER — Other Ambulatory Visit: Payer: Self-pay

## 2021-02-18 ENCOUNTER — Encounter: Payer: Self-pay | Admitting: Family Medicine

## 2021-02-18 ENCOUNTER — Ambulatory Visit (INDEPENDENT_AMBULATORY_CARE_PROVIDER_SITE_OTHER): Payer: Medicare HMO | Admitting: Family Medicine

## 2021-02-18 VITALS — BP 130/86 | HR 82 | Temp 98.2°F | Ht 59.0 in | Wt 342.2 lb

## 2021-02-18 DIAGNOSIS — E1159 Type 2 diabetes mellitus with other circulatory complications: Secondary | ICD-10-CM

## 2021-02-18 DIAGNOSIS — I152 Hypertension secondary to endocrine disorders: Secondary | ICD-10-CM | POA: Diagnosis not present

## 2021-02-18 DIAGNOSIS — Z0000 Encounter for general adult medical examination without abnormal findings: Secondary | ICD-10-CM | POA: Diagnosis not present

## 2021-02-18 DIAGNOSIS — E782 Mixed hyperlipidemia: Secondary | ICD-10-CM

## 2021-02-18 DIAGNOSIS — E1169 Type 2 diabetes mellitus with other specified complication: Secondary | ICD-10-CM

## 2021-02-18 DIAGNOSIS — Z5181 Encounter for therapeutic drug level monitoring: Secondary | ICD-10-CM

## 2021-02-18 DIAGNOSIS — I872 Venous insufficiency (chronic) (peripheral): Secondary | ICD-10-CM

## 2021-02-18 DIAGNOSIS — F319 Bipolar disorder, unspecified: Secondary | ICD-10-CM

## 2021-02-18 DIAGNOSIS — E119 Type 2 diabetes mellitus without complications: Secondary | ICD-10-CM | POA: Diagnosis not present

## 2021-02-18 DIAGNOSIS — R69 Illness, unspecified: Secondary | ICD-10-CM | POA: Diagnosis not present

## 2021-02-18 DIAGNOSIS — Z5309 Procedure and treatment not carried out because of other contraindication: Secondary | ICD-10-CM | POA: Diagnosis not present

## 2021-02-18 DIAGNOSIS — Z85850 Personal history of malignant neoplasm of thyroid: Secondary | ICD-10-CM

## 2021-02-18 DIAGNOSIS — Z23 Encounter for immunization: Secondary | ICD-10-CM

## 2021-02-18 DIAGNOSIS — E890 Postprocedural hypothyroidism: Secondary | ICD-10-CM

## 2021-02-18 LAB — COMPREHENSIVE METABOLIC PANEL
ALT: 22 U/L (ref 0–35)
AST: 23 U/L (ref 0–37)
Albumin: 3.5 g/dL (ref 3.5–5.2)
Alkaline Phosphatase: 82 U/L (ref 39–117)
BUN: 15 mg/dL (ref 6–23)
CO2: 27 mEq/L (ref 19–32)
Calcium: 9.1 mg/dL (ref 8.4–10.5)
Chloride: 103 mEq/L (ref 96–112)
Creatinine, Ser: 0.9 mg/dL (ref 0.40–1.20)
GFR: 73.42 mL/min (ref >60.00)
Glucose, Bld: 171 mg/dL — ABNORMAL HIGH (ref 70–99)
Potassium: 3.7 mEq/L (ref 3.5–5.1)
Sodium: 138 mEq/L (ref 135–145)
Total Bilirubin: 0.5 mg/dL (ref 0.2–1.2)
Total Protein: 7.7 g/dL (ref 6.0–8.3)

## 2021-02-18 LAB — LIPID PANEL
Cholesterol: 165 mg/dL (ref 0–200)
HDL: 48.2 mg/dL (ref >39.00)
NonHDL: 117.07
Total CHOL/HDL Ratio: 3
Triglycerides: 229 mg/dL — ABNORMAL HIGH (ref 0.0–149.0)
VLDL: 45.8 mg/dL — ABNORMAL HIGH (ref 0.0–40.0)

## 2021-02-18 LAB — TSH: TSH: 0.51 u[IU]/mL (ref 0.35–5.50)

## 2021-02-18 LAB — POCT GLYCOSYLATED HEMOGLOBIN (HGB A1C): Hemoglobin A1C: 7.1 % — AB (ref 4.0–5.6)

## 2021-02-18 LAB — LDL CHOLESTEROL, DIRECT: Direct LDL: 93 mg/dL

## 2021-02-18 MED ORDER — ATORVASTATIN CALCIUM 40 MG PO TABS: 40.0000 mg | ORAL_TABLET | Freq: Every day | ORAL | 0 refills | Status: DC

## 2021-02-18 MED ORDER — SHINGRIX 50 MCG/0.5ML IM SUSR: 0.5000 mL | Freq: Once | INTRAMUSCULAR | 0 refills | Status: AC

## 2021-02-18 MED ORDER — GLIPIZIDE ER 5 MG PO TB24: 5.0000 mg | ORAL_TABLET | Freq: Every day | ORAL | 1 refills | Status: DC

## 2021-02-18 NOTE — Patient Instructions (Addendum)
Please return in 6 months for diabetes.  I will release your lab results to you on your MyChart account with further instructions. Please reply with any questions.    Please take the prescription for Shingrix to the pharmacy so they may administer the vaccinations. Your insurance will then cover the injections.   Please start the glucotrol xl 5mg  daily for your diabetes.   If you have any questions or concerns, please don't hesitate to send me a message via MyChart or call the office at [phone]. Thank you for visiting with [place] today! It's our pleasure caring for you.
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
## 2021-02-19 LAB — URINALYSIS, ROUTINE W REFLEX MICROSCOPIC
Bilirubin Urine: NEGATIVE
Ketones, ur: NEGATIVE
Leukocytes,Ua: NEGATIVE
Nitrite: NEGATIVE
Specific Gravity, Urine: 1.01 (ref 1.000–1.030)
Total Protein, Urine: NEGATIVE
Urine Glucose: NEGATIVE
Urobilinogen, UA: 0.2 (ref 0.0–1.0)
pH: 7 (ref 5.0–8.0)

## 2021-02-19 LAB — MICROALBUMIN / CREATININE URINE RATIO
Creatinine,U: 81.2 mg/dL
Microalb Creat Ratio: 2.8 mg/g (ref 0.0–30.0)
Microalb, Ur: 2.2 mg/dL — ABNORMAL HIGH (ref 0.0–1.9)

## 2021-02-19 LAB — LITHIUM LEVEL: Lithium Lvl: 0.4 mmol/L — ABNORMAL LOW (ref 0.6–1.2)

## 2021-02-19 LAB — HEPATITIS C ANTIBODY
Hepatitis C Ab: NONREACTIVE
SIGNAL TO CUT-OFF: 0.07 (ref <1.00)

## 2021-02-20 ENCOUNTER — Other Ambulatory Visit: Payer: Self-pay

## 2021-02-20 MED ORDER — ATORVASTATIN CALCIUM 80 MG PO TABS: 80.0000 mg | ORAL_TABLET | Freq: Every day | ORAL | 3 refills | Status: DC

## 2021-05-06 ENCOUNTER — Other Ambulatory Visit: Payer: Self-pay | Admitting: Family Medicine

## 2021-06-04 ENCOUNTER — Other Ambulatory Visit: Payer: Self-pay

## 2021-06-04 MED ORDER — LEVOTHYROXINE SODIUM 112 MCG PO TABS: ORAL_TABLET | ORAL | 3 refills | Status: DC

## 2021-07-05 ENCOUNTER — Telehealth: Payer: Self-pay | Admitting: Family Medicine

## 2021-07-05 NOTE — Telephone Encounter (Signed)
Copied from [place] [id number]. Topic: Medicare AWV ?>> Jul 05, 2021 12:56 PM Harris-Coley, Hannah Beat wrote: ?Reason for CRM: Left message for patient to schedule Annual Wellness Visit.  Please schedule with Nurse Health Advisor Charlott Rakes, RN at [facility].  Please call [phone] ask for Juliann Pulse ?

## 2021-07-14 ENCOUNTER — Other Ambulatory Visit: Payer: Self-pay | Admitting: Family Medicine

## 2021-08-19 ENCOUNTER — Ambulatory Visit: Payer: Medicare Other | Admitting: Family Medicine

## 2021-08-23 ENCOUNTER — Other Ambulatory Visit: Payer: Self-pay | Admitting: Orthopedic Surgery

## 2021-08-23 DIAGNOSIS — R2242 Localized swelling, mass and lump, left lower limb: Secondary | ICD-10-CM

## 2021-09-01 ENCOUNTER — Ambulatory Visit
Admission: RE | Admit: 2021-09-01 | Discharge: 2021-09-01 | Disposition: A | Discharge status: Home / Self Care | Payer: Medicare Other | Source: Ambulatory Visit | Attending: Orthopedic Surgery | Admitting: Orthopedic Surgery

## 2021-09-01 DIAGNOSIS — R2242 Localized swelling, mass and lump, left lower limb: Secondary | ICD-10-CM

## 2021-09-01 MED ORDER — GADOBENATE DIMEGLUMINE 529 MG/ML IV SOLN
20.0000 mL | Freq: Once | INTRAVENOUS | Status: AC | PRN
  Administered 2021-09-01: 20 mL via INTRAVENOUS

## 2021-09-23 ENCOUNTER — Ambulatory Visit (INDEPENDENT_AMBULATORY_CARE_PROVIDER_SITE_OTHER): Payer: Medicare Other | Admitting: Orthopedic Surgery

## 2021-09-23 DIAGNOSIS — Z6841 Body Mass Index (BMI) 40.0 and over, adult: Secondary | ICD-10-CM | POA: Diagnosis not present

## 2021-09-23 DIAGNOSIS — M25562 Pain in left knee: Secondary | ICD-10-CM

## 2021-09-23 DIAGNOSIS — G8929 Other chronic pain: Secondary | ICD-10-CM | POA: Diagnosis not present

## 2021-09-24 ENCOUNTER — Encounter: Payer: Self-pay | Admitting: Orthopedic Surgery
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
## 2021-10-21 ENCOUNTER — Other Ambulatory Visit: Payer: Self-pay | Admitting: Family Medicine

## 2022-01-13 ENCOUNTER — Encounter: Payer: Self-pay | Admitting: *Deleted

## 2022-02-03 ENCOUNTER — Telehealth: Payer: Self-pay | Admitting: Family Medicine

## 2022-02-03 NOTE — Telephone Encounter (Signed)
Copied from [place] [id number]. Topic: Medicare AWV >> Feb 03, 2022  1:23 PM Devoria Glassing wrote: Reason for CRM: Called patient to schedule Annual Wellness Visit.  Please schedule with Nurse Health Advisor Charlott Rakes, RN at [facility]. This appt can be telephone or office visit. Please call [phone] ask for [facility]

## 2022-02-05 ENCOUNTER — Other Ambulatory Visit: Payer: Self-pay | Admitting: Family Medicine

## 2022-02-12 ENCOUNTER — Ambulatory Visit (INDEPENDENT_AMBULATORY_CARE_PROVIDER_SITE_OTHER): Payer: Medicare Other | Admitting: Family

## 2022-02-12 ENCOUNTER — Encounter: Payer: Self-pay | Admitting: Family

## 2022-02-12 VITALS — BP 165/99 | HR 84 | Temp 97.7°F | Ht 59.0 in | Wt 337.4 lb

## 2022-02-12 DIAGNOSIS — R030 Elevated blood-pressure reading, without diagnosis of hypertension: Secondary | ICD-10-CM | POA: Diagnosis not present

## 2022-02-12 DIAGNOSIS — R300 Dysuria: Secondary | ICD-10-CM | POA: Diagnosis not present

## 2022-02-12 LAB — POCT URINALYSIS DIPSTICK
Bilirubin, UA: NEGATIVE
Glucose, UA: NEGATIVE
Ketones, UA: NEGATIVE
Leukocytes, UA: NEGATIVE
Nitrite, UA: NEGATIVE
Protein, UA: NEGATIVE
Spec Grav, UA: 1.01 (ref 1.010–1.025)
Urobilinogen, UA: 0.2 E.U./dL
pH, UA: 6.5 (ref 5.0–8.0)

## 2022-02-12 NOTE — Progress Notes (Signed)
Patient ID: MILTA CROSON, female    DOB: 02-17-1971, 51 y.o.   MRN: [id number]  Chief Complaint  Patient presents with   Dysuria    Pt c/o burning during urination and  red spot in urine, Present for days.    HPI:     Dysuria:  reports having burning when she urinates, red specks in her urine. Denies fever, nausea, foul odor, no pelvic, flank, low back pain or vaginal itching or discharge. Reports having blood in her urine last year and had kidney stone.       Assessment & Plan:  1. Dysuria - UA negative, positive for blood, will send out for culture. Advised on drinking 2L water qd.  - Urine Culture - POCT Urinalysis Dipstick  2. Elevated blood pressure reading -  pt reports getting bad news today & it has made her upset; denies any sx, advised pt on low sodium diet, reducing her stress, start exercising.   Subjective:    Outpatient Medications Prior to Visit  Medication Sig Dispense Refill   acetaminophen (TYLENOL) 500 MG tablet Take 500 mg by mouth every 6 (six) hours as needed for mild pain, moderate pain or headache.     atorvastatin (LIPITOR) 80 MG tablet TAKE 1 TABLET BY MOUTH ONCE  DAILY 100 tablet 2   Calcium Carbonate-Vitamin D 600-400 MG-UNIT tablet Take 1 tablet by mouth daily.      Coenzyme Q10 10 MG capsule Take 10 mg by mouth daily.     diphenhydrAMINE (BENADRYL) 50 MG capsule Take 50 mg by mouth every 6 (six) hours as needed for allergies.      glipiZIDE (GLUCOTROL XL) 5 MG 24 hr tablet TAKE 1 TABLET BY MOUTH ONCE  DAILY WITH BREAKFAST 90 tablet 3   glucose blood test strip Check blood sugar one - two times daily 100 each 12   levothyroxine (SYNTHROID) 112 MCG tablet TAKE 2 TABLETS BY MOUTH ONCE  DAILY BEFORE BREAKFAST 60 tablet 0   LITHOBID 300 MG CR tablet Take 600 mg by mouth at bedtime.      LORazepam (ATIVAN) 0.5 MG tablet Take 1 tablet by mouth 2 (two) times daily as needed for anxiety.      OVER THE COUNTER MEDICATION Take 1 capsule by mouth daily. VH -  hot flashes     No facility-administered medications prior to visit.   Past Medical History:  Diagnosis Date   Ambulates with cane    Anxiety    Arthritis    Bipolar disorder ([place]) 12/09/2018   Cataract    bilateral - MD just watching now   Controlled type 2 diabetes mellitus without complication, without long-term current use of insulin ([place]) 12/09/2018   Depression    History of kidney stones    Hyperlipidemia    Hypertension    Hypoglycemia    Morbid obesity ([place]) 12/09/2018   Nephrolithiasis 12/09/2018   OAB (overactive bladder)    Obesity    Post-operative hypothyroidism 12/09/2018   Thyroid cancer ([place])    Wears partial dentures    upper   Past Surgical History:  Procedure Laterality Date   COLONOSCOPY WITH PROPOFOL N/A 12/13/2019   Procedure: COLONOSCOPY WITH PROPOFOL;  Surgeon: Ladene Artist, MD;  Location: [facility];  Service: Endoscopy;  Laterality: N/A;   ESOPHAGOGASTRODUODENOSCOPY (EGD) WITH PROPOFOL N/A 12/23/2016   Procedure: ESOPHAGOGASTRODUODENOSCOPY (EGD) WITH PROPOFOL;  Surgeon: Ladene Artist, MD;  Location: [facility];  Service: Endoscopy;  Laterality: N/A;   HEEL  SPUR SURGERY Left    LITHOTRIPSY     PARTIAL HYSTERECTOMY     POLYPECTOMY  12/13/2019   Procedure: POLYPECTOMY;  Surgeon: Ladene Artist, MD;  Location: [facility];  Service: Endoscopy;;   rotator cuff surgery Bilateral    THYROIDECTOMY     TONSILLECTOMY AND ADENOIDECTOMY     and adenoidectomy    Allergies  Allergen Reactions   Myrbetriq [Mirabegron] Anaphylaxis and Itching   Morphine And Related Nausea And Vomiting   Omeprazole     Stomach pain      Objective:    Physical Exam Vitals and nursing note reviewed.  Constitutional:      Appearance: Normal appearance. She is morbidly obese.  Cardiovascular:     Rate and Rhythm: Normal rate and regular rhythm.  Pulmonary:     Effort: Pulmonary effort is normal.     Breath sounds: Normal breath sounds.  Musculoskeletal:         General: Normal range of motion.  Skin:    General: Skin is warm and dry.  Neurological:     Mental Status: She is alert.  Psychiatric:        Mood and Affect: Mood normal.        Behavior: Behavior normal.    BP (!) 165/99 (BP Location: Left Arm, Patient Position: Sitting, Cuff Size: Large)   Pulse 84   Temp 97.7 F (36.5 C) (Temporal)   Ht '4\' 11"'$  (1.499 m)   Wt (!) 337 lb 6 oz (153 kg)   LMP  (LMP Unknown)   SpO2 98%   BMI 68.14 kg/m  Wt Readings from Last 3 Encounters:  02/12/22 (!) 337 lb 6 oz (153 kg)  02/18/21 (!) 342 lb 3.2 oz (155.2 kg)  12/03/20 (!) 335 lb 3.2 oz (152 kg)       Jeanie Sewer, NP

## 2022-02-13 ENCOUNTER — Telehealth: Payer: Self-pay | Admitting: Family Medicine

## 2022-02-13 NOTE — Telephone Encounter (Signed)
I called and tried to LVM for pt but vm box was full. Colletta Maryland has not reviewed labs yet and I will contact her when Colletta Maryland has reviewed them due to her not being able to access MyChart.

## 2022-02-13 NOTE — Telephone Encounter (Signed)
Patient requests to be called at ph# [phone]  to be given lab results.   Patient states she has received 2 MyChart messages but she is unable to get into her MyChart.  Patient declined calling or being transferred to [place] desk.

## 2022-02-14 ENCOUNTER — Telehealth: Payer: Self-pay | Admitting: Family Medicine

## 2022-02-14 DIAGNOSIS — R300 Dysuria: Secondary | ICD-10-CM

## 2022-02-14 NOTE — Telephone Encounter (Signed)
please call and let her know we don't have the results back yet, it sometimes takes up to 3 days, thx.

## 2022-02-14 NOTE — Telephone Encounter (Signed)
Patient requests to be called at ph# [phone] to be given Urinalysis results

## 2022-02-19 NOTE — Telephone Encounter (Signed)
Pt has been called several times and a voicemail was left. I called pt to let her know I will check with our lab person to see what is going on with her urine culture results and give her a call back.  She stated she was not able to get into her voicemail or her MyChart account. Pt gave a verbalized understanding.

## 2022-02-19 NOTE — Telephone Encounter (Signed)
I called pt back. Let her know I apologies due to labs. She is still having symptoms and Pt states she will come in to to leave [place] a urine when she can. Orders placed for future. Melissa isn't sure what happen with pt's labs.

## 2022-02-19 NOTE — Addendum Note (Signed)
Addended by: Verne Grain on: 02/19/2022 02:31 PM   Modules accepted: Orders

## 2022-02-21 ENCOUNTER — Other Ambulatory Visit: Payer: 59

## 2022-02-21 DIAGNOSIS — R300 Dysuria: Secondary | ICD-10-CM

## 2022-02-23 LAB — URINE CULTURE
MICRO NUMBER:: 14141681
SPECIMEN QUALITY:: ADEQUATE

## 2022-02-23 NOTE — Progress Notes (Signed)
Please call patient with urine culture results - does not use MyChart - culture is negative, no infection. Apologize again for having her to come back to repeat due to lost 1st sample! Thx

## 2022-02-24 NOTE — Progress Notes (Signed)
If she still has blood in her urine, we can refer her to Urology. Please order referral if she agrees, thanks.

## 2022-03-21 ENCOUNTER — Other Ambulatory Visit: Payer: Self-pay | Admitting: Family Medicine

## 2022-03-21 ENCOUNTER — Telehealth: Payer: Self-pay | Admitting: Family Medicine

## 2022-03-21 NOTE — Telephone Encounter (Signed)
Patient is requesting to transfer care from Dr. Jonni Sanger to Dr. Cherlynn Kaiser. Is this okay with you?

## 2022-03-21 NOTE — Telephone Encounter (Signed)
Patient expressed early on that she wanted an older female provider. Please advise on next steps please.

## 2022-04-03 ENCOUNTER — Encounter: Payer: Self-pay | Admitting: *Deleted

## 2022-04-25 ENCOUNTER — Other Ambulatory Visit: Payer: Self-pay | Admitting: Family Medicine

## 2022-04-26 ENCOUNTER — Other Ambulatory Visit: Payer: Self-pay | Admitting: Family Medicine

## 2022-05-13 ENCOUNTER — Ambulatory Visit (INDEPENDENT_AMBULATORY_CARE_PROVIDER_SITE_OTHER): Payer: 59

## 2022-05-13 VITALS — Wt 337.0 lb

## 2022-05-13 DIAGNOSIS — Z1231 Encounter for screening mammogram for malignant neoplasm of breast: Secondary | ICD-10-CM | POA: Diagnosis not present

## 2022-05-13 DIAGNOSIS — Z0000 Encounter for general adult medical examination without abnormal findings: Secondary | ICD-10-CM

## 2022-05-13 NOTE — Addendum Note (Signed)
Addended by: Willette Brace on: 05/13/2022 03:25 PM   Modules accepted: Orders

## 2022-05-13 NOTE — Progress Notes (Addendum)
I connected with  Megan Moreno on 05/13/22 by a audio enabled telemedicine application and verified that I am speaking with the correct person using two identifiers.  Patient Location: Home  Provider Location: Office/Clinic  I discussed the limitations of evaluation and management by telemedicine. The patient expressed understanding and agreed to proceed.   Subjective:   Megan Moreno is a 52 y.o. female who presents for Medicare Annual (Subsequent) preventive examination.  Review of Systems     Cardiac Risk Factors include: advanced age (>42mn, >>7women);diabetes mellitus;dyslipidemia;sedentary lifestyle;hypertension;obesity (BMI >30kg/m2)     Objective:    Today's Vitals   05/13/22 1356  Weight: (!) 337 lb (152.9 kg)   Body mass index is 68.07 kg/m.     05/13/2022    2:05 PM 12/13/2019    7:08 AM 02/01/2019    2:31 PM 10/21/2018    2:08 PM 06/18/2018    9:04 PM 02/06/2018   11:59 PM 03/16/2017    1:06 PM  Advanced Directives  Does Patient Have a Medical Advance Directive? No No No No No No No  Would patient like information on creating a medical advance directive? No - Patient declined No - Patient declined Yes (MAU/Ambulatory/Procedural Areas - Information given)        Current Medications (verified) Outpatient Encounter Medications as of 05/13/2022  Medication Sig   acetaminophen (TYLENOL) 500 MG tablet Take 500 mg by mouth every 6 (six) hours as needed for mild pain, moderate pain or headache.   atorvastatin (LIPITOR) 80 MG tablet TAKE 1 TABLET BY MOUTH ONCE  DAILY   Calcium Carbonate-Vitamin D 600-400 MG-UNIT tablet Take 1 tablet by mouth daily.    diphenhydrAMINE (BENADRYL) 50 MG capsule Take 50 mg by mouth every 6 (six) hours as needed for allergies.    glipiZIDE (GLUCOTROL XL) 5 MG 24 hr tablet TAKE 1 TABLET BY MOUTH ONCE  DAILY WITH BREAKFAST   levothyroxine (SYNTHROID) 112 MCG tablet TAKE 2 TABLETS BY MOUTH ONCE  DAILY BEFORE BREAKFAST   LITHOBID 300 MG CR  tablet Take 600 mg by mouth at bedtime.    LORazepam (ATIVAN) 0.5 MG tablet Take 1 tablet by mouth 2 (two) times daily as needed for anxiety.    OVER THE COUNTER MEDICATION Take 1 capsule by mouth daily. VH - hot flashes   glucose blood test strip Check blood sugar one - two times daily (Patient not taking: Reported on 05/13/2022)   [DISCONTINUED] Coenzyme Q10 10 MG capsule Take 10 mg by mouth daily.   No facility-administered encounter medications on file as of 05/13/2022.    Allergies (verified) Myrbetriq [mirabegron], Codeine, Morphine and related, Omeprazole, and Statins   History: Past Medical History:  Diagnosis Date   Ambulates with cane    Anxiety    Arthritis    [place]ipolar disorder [place] 12/09/2018   Cataract    bilateral - MD just watching now   Controlled type 2 diabetes mellitus without complication, without long-term current use of insulin [place] 12/[place]   Depression    History of kidney stones    Hyperlipidemia    Hypertension    Hypoglycemia    [place]   Nephrolithiasis 12/09/2018   OAB (overactive bladder)    Obesity    Post-operative hypothyroidism 12/09/2018   Thyroid cancer ([facility]    Wears partial dentures    upper   Past Surgical History:  Procedure Laterality Date   COLONOSCOPY WITH PROPOFOL N/A 12/13/2019   Procedure: COLONOSCOPY WITH  PROPOFOL;  Surgeon: Ladene Artist, MD;  Location: [facility];  Service: Endoscopy;  Laterality: N/A;   ESOPHAGOGASTRODUODENOSCOPY (EGD) WITH PROPOFOL N/A 12/23/2016   Procedure: ESOPHAGOGASTRODUODENOSCOPY (EGD) WITH PROPOFOL;  Surgeon: Ladene Artist, MD;  Location: [facility];  Service: Endoscopy;  Laterality: N/A;   HEEL SPUR SURGERY Left    LITHOTRIPSY     PARTIAL HYSTERECTOMY     POLYPECTOMY  12/13/2019   Procedure: POLYPECTOMY;  Surgeon: Ladene Artist, MD;  Location: [facility];  Service: Endoscopy;;   rotator cuff surgery Bilateral    THYROIDECTOMY     TONSILLECTOMY AND ADENOIDECTOMY      and adenoidectomy    Family History  Problem Relation Age of Onset   Ovarian cancer Mother    Colon cancer Neg Hx    Esophageal cancer Neg Hx    Pancreatic cancer Neg Hx    Stomach cancer Neg Hx    Liver disease Neg Hx    Thyroid cancer Neg Hx    Thyroid disease Neg Hx    Rectal cancer Neg Hx    Social History   Socioeconomic History   Marital status: Single    Spouse name: Not on file   Number of children: Not on file   Years of education: Not on file   Highest education level: Not on file  Occupational History   Not on file  Tobacco Use   Smoking status: Never   Smokeless tobacco: Never  Vaping Use   Vaping Use: Never used  Substance and Sexual Activity   Alcohol use: Yes    Comment: occasionally   Drug use: No   Sexual activity: Never    Birth control/protection: Surgical    Comment: Hysterectomy  Other Topics Concern   Not on file  Social History Narrative   Not on file   Social Determinants of Health   Financial Resource Strain: Low Risk  (05/13/2022)   Overall Financial Resource Strain (CARDIA)    Difficulty of Paying Living Expenses: Not hard at all  Food Insecurity: No Food Insecurity (05/13/2022)   Hunger Vital Sign    Worried About Running Out of Food in the Last Year: Never true    Ran Out of Food in the Last Year: Never true  Transportation Needs: No Transportation Needs (05/13/2022)   PRAPARE - Hydrologist (Medical): No    Lack of Transportation (Non-Medical): No  Physical Activity: Inactive (05/13/2022)   Exercise Vital Sign    Days of Exercise per Week: 0 days    Minutes of Exercise per Session: 0 min  Stress: No Stress Concern Present (05/13/2022)   [place]    Feeling of Stress : Not at all  Social Connections: Socially Isolated (05/13/2022)   Social Connection and Isolation Panel [NHANES]    Frequency of Communication with Friends and Family:  More than three times a week    Frequency of Social Gatherings with Friends and Family: Once a week    Attends Religious Services: Never    Marine scientist or Organizations: No    Attends Music therapist: Never    Marital Status: Never married    Tobacco Counseling Counseling given: Not Answered   Clinical Intake:  Pre-visit preparation completed: Yes  Pain : No/denies pain     BMI - recorded: 68.07 Nutritional Risks: None Diabetes: Yes CBG done?: No Did pt. bring in CBG monitor from home?: No  How often do you need to have someone help you when you read instructions, pamphlets, or other written materials from your doctor or pharmacy?: 1 - Never  Diabetic? Nutrition Risk Assessment:  Has the patient had any N/V/D within the last 2 months?  Loose stools at times  Does the patient have any non-healing wounds?  No  Has the patient had any unintentional weight loss or weight gain?  No   Diabetes:  Is the patient diabetic?  Yes  If diabetic, was a CBG obtained today?  No  Did the patient bring in their glucometer from home?  No  How often do you monitor your CBG's? N/A.   Financial Strains and Diabetes Management:  Are you having any financial strains with the device, your supplies or your medication? No .  Does the patient want to be seen by Chronic Care Management for management of their diabetes?  No  Would the patient like to be referred to a Nutritionist or for Diabetic Management?  No   Diabetic Exams:  Diabetic Eye Exam: Overdue for diabetic eye exam. Pt has been advised about the importance in completing this exam. Patient advised to call and schedule an eye exam. Diabetic Foot Exam: Overdue, Pt has been advised about the importance in completing this exam. Pt is scheduled for diabetic foot exam on next appt .  Interpreter Needed?: No  Information entered by :: Charlott Rakes, LPN   Activities of Daily Living    05/13/2022    2:06 PM   In your present state of health, do you have any difficulty performing the following activities:  Hearing? 1  Comment right ear fluid at times  Vision? 0  Difficulty concentrating or making decisions? 0  Walking or climbing stairs? 1  Dressing or bathing? 0  Doing errands, shopping? 0  Preparing Food and eating ? N  Using the Toilet? N  In the past six months, have you accidently leaked urine? Y  Comment at times  Do you have problems with loss of bowel control? N  Managing your Medications? N  Managing your Finances? N  Housekeeping or managing your Housekeeping? N    Patient Care Team: Leamon Arnt, MD as PCP - General (Family Medicine) Noemi Chapel, NP as Nurse Practitioner Laurence Aly, [place] as Consulting Physician (Optometry) Ladene Artist, MD as Consulting Physician (Gastroenterology)  Indicate any recent Medical Services you may have received from other than [facility] providers in the past year (date may be approximate).     Assessment:   This is a routine wellness examination for [place].  Hearing/Vision screen Hearing Screening - Comments:: Pt stated slight hearing issues in right hearing  Vision Screening - Comments:: Pt follows up with [facility] with Dr Hassell Done   Dietary issues and exercise activities discussed: Current Exercise Habits: The patient does not participate in regular exercise at present   Goals Addressed             This Visit's Progress    Patient Stated       Lose weight to get below 300 lbs        Depression Screen    05/13/2022    2:02 PM 02/01/2019    2:32 PM  PHQ 2/9 Scores  PHQ - 2 Score 0 0    Fall Risk    05/13/2022    2:06 PM 08/05/2019    2:26 PM 02/01/2019    2:32 PM  Fall Risk   Falls in the  past year? 0 0 1  Number falls in past yr: 0 0 0  Injury with Fall? 0 0 1  Risk for fall due to : Impaired vision  Impaired balance/gait  Follow up Falls prevention discussed  Education provided;Falls prevention discussed;Falls  evaluation completed    FALL RISK PREVENTION PERTAINING TO THE HOME:  Any stairs in or around the home? Yes  If so, are there any without handrails? No  Home free of loose throw rugs in walkways, pet beds, electrical cords, etc? Yes  Adequate lighting in your home to reduce risk of falls? Yes   ASSISTIVE DEVICES UTILIZED TO PREVENT FALLS:  Life alert? No  Use of a cane, walker or w/c? No  Grab bars in the bathroom? No  Shower chair or bench in shower? No  Elevated toilet seat or a handicapped toilet? No   TIMED UP AND GO:  Was the test performed? No .   Cognitive Function:        05/13/2022    2:08 PM  6CIT Screen  What Year? 0 points  What month? 0 points  What time? 0 points  Count back from 20 0 points  Months in reverse 4 points  Repeat phrase 4 points  Total Score 8 points    Immunizations Immunization History  Administered Date(s) Administered   DTaP 05/31/2009   Influenza, Quadrivalent, Recombinant, Inj, Pf 03/03/2018   Influenza,inj,Quad PF,6+ Mos 12/27/2016, 12/09/2018, 02/18/2021   Influenza-Unspecified 02/14/2013, 03/13/2014, 03/01/2015, 12/09/2018, 01/30/2020   Janssen (J&J) SARS-COV-2 Vaccination 08/21/2019   Pneumococcal Polysaccharide-23 05/13/2019   Pneumococcal-Unspecified 12/08/2013    TDAP status: Due, Education has been provided regarding the importance of this vaccine. Advised may receive this vaccine at local pharmacy or Health Dept. Aware to provide a copy of the vaccination record if obtained from local pharmacy or Health Dept. Verbalized acceptance and understanding.  Flu Vaccine status: Up to date  Pneumococcal vaccine status: Up to date  Covid-19 vaccine status: Information provided on how to obtain vaccines.   Qualifies for Shingles Vaccine? Yes   Zostavax completed No   Shingrix Completed?: No.    Education has been provided regarding the importance of this vaccine. Patient has been advised to call insurance company to determine  out of pocket expense if they have not yet received this vaccine. Advised may also receive vaccine at local pharmacy or Health Dept. Verbalized acceptance and understanding.  Screening Tests Health Maintenance  Topic Date Due   Zoster Vaccines- Shingrix (1 of 2) Never done   DTaP/Tdap/Td (2 - Tdap) 06/01/2019   COVID-19 Vaccine (2 - Janssen risk series) 09/18/2019   OPHTHALMOLOGY EXAM  06/09/2020   MAMMOGRAM  08/10/2020   HEMOGLOBIN A1C  08/18/2021   INFLUENZA VACCINE  11/19/2021   Diabetic kidney evaluation - eGFR measurement  02/18/2022   Diabetic kidney evaluation - Urine ACR  02/18/2022   FOOT EXAM  02/18/2022   COLONOSCOPY (Pts 45-24yr Insurance coverage will need to be confirmed)  12/13/2022   Medicare Annual Wellness (AWV)  05/14/2023   Hepatitis C Screening  Completed   HPV VACCINES  Aged Out   HIV Screening  Discontinued    Health Maintenance  Health Maintenance Due  Topic Date Due   Zoster Vaccines- Shingrix (1 of 2) Never done   DTaP/Tdap/Td (2 - Tdap) 06/01/2019   COVID-19 Vaccine (2 - Janssen risk series) 09/18/2019   OPHTHALMOLOGY EXAM  06/09/2020   MAMMOGRAM  08/10/2020   HEMOGLOBIN A1C  08/18/2021   INFLUENZA  VACCINE  11/19/2021   Diabetic kidney evaluation - eGFR measurement  02/18/2022   Diabetic kidney evaluation - Urine ACR  02/18/2022   FOOT EXAM  02/18/2022    Colorectal cancer screening: Type of screening: Colonoscopy. Completed 12/13/19. Repeat every 3 years  Mammogram status: Ordered 05/13/22. Pt provided with contact info and advised to call to schedule appt.       Additional Screening:  Hepatitis C Screening: Completed 02/18/21  Vision Screening: Recommended annual ophthalmology exams for early detection of glaucoma and other disorders of the eye. Is the patient up to date with their annual eye exam?  no Who is the provider or what is the name of the office in which the patient attends annual eye exams? Dr Hassell Done  If pt is not established  with a provider, would they like to be referred to a provider to establish care? Yes a provider near [place].   Dental Screening: Recommended annual dental exams for proper oral hygiene  Community Resource Referral / Chronic Care Management: CRR required this visit?  No   CCM required this visit?  No      Plan:     I have personally reviewed and noted the following in the patient's chart:   Medical and social history Use of alcohol, tobacco or illicit drugs  Current medications and supplements including opioid prescriptions. Patient is not currently taking opioid prescriptions. Functional ability and status Nutritional status Physical activity Advanced directives List of other physicians Hospitalizations, surgeries, and ER visits in previous 12 months Vitals Screenings to include cognitive, depression, and falls Referrals and appointments  In addition, I have reviewed and discussed with patient certain preventive protocols, quality metrics, and best practice recommendations. A written personalized care plan for preventive services as well as general preventive health recommendations were provided to patient.     Willette Brace, LPN   [id number]/38/[id number]   Nurse Notes: Pt is requesting a urology referral for a doctor near [place] she has moved. No bleeding in urine but is concerned with urine from previous appt.

## 2022-05-13 NOTE — Patient Instructions (Signed)
Megan Moreno , Thank you for taking time to come for your Medicare Wellness Visit. I appreciate your ongoing commitment to your health goals. Please review the following plan we discussed and let me know if I can assist you in the future.   These are the goals we discussed:  Goals      Patient Stated     Lose weight to get below 300 lbs      Weight (lb) < 250 lb (113.4 kg)     Patient would like to continue to lose weight and is interested in surgery to remove excess skin.         This is a list of the screening recommended for you and due dates:  Health Maintenance  Topic Date Due   Zoster (Shingles) Vaccine (1 of 2) Never done   DTaP/Tdap/Td vaccine (2 - Tdap) 06/01/2019   COVID-19 Vaccine (2 - Janssen risk series) 09/18/2019   Eye exam for diabetics  06/09/2020   Mammogram  08/10/2020   Hemoglobin A1C  08/18/2021   Flu Shot  11/19/2021   Yearly kidney function blood test for diabetes  02/18/2022   Yearly kidney health urinalysis for diabetes  02/18/2022   Complete foot exam   02/18/2022   Colon Cancer Screening  12/13/2022   Medicare Annual Wellness Visit  05/14/2023   Hepatitis C Screening: USPSTF Recommendation to screen - Ages 18-79 yo.  Completed   HPV Vaccine  Aged Out   HIV Screening  Discontinued    Advanced directives: Advance directive discussed with you today. Even though you declined this today please call our office should you change your mind and we can give you the proper paperwork for you to fill out.  Conditions/risks identified: get down to 300 lbs or less   Next appointment: Follow up in one year for your annual wellness visit.   Preventive Care 40-64 Years, Female Preventive care refers to lifestyle choices and visits with your health care provider that can promote health and wellness. What does preventive care include? A yearly physical exam. This is also called an annual well check. Dental exams once or twice a year. Routine eye exams. Ask your  health care provider how often you should have your eyes checked. Personal lifestyle choices, including: Daily care of your teeth and gums. Regular physical activity. Eating a healthy diet. Avoiding tobacco and drug use. Limiting alcohol use. Practicing safe sex. Taking low-dose aspirin daily starting at age 16. Taking vitamin and mineral supplements as recommended by your health care provider. What happens during an annual well check? The services and screenings done by your health care provider during your annual well check will depend on your age, overall health, lifestyle risk factors, and family history of disease. Counseling  Your health care provider may ask you questions about your: Alcohol use. Tobacco use. Drug use. Emotional well-being. Home and relationship well-being. Sexual activity. Eating habits. Work and work Statistician. Method of birth control. Menstrual cycle. Pregnancy history. Screening  You may have the following tests or measurements: Height, weight, and BMI. Blood pressure. Lipid and cholesterol levels. These may be checked every 5 years, or more frequently if you are over 10 years old. Skin check. Lung cancer screening. You may have this screening every year starting at age 5 if you have a 30-pack-year history of smoking and currently smoke or have quit within the past 15 years. Fecal occult blood test (FOBT) of the stool. You may have this test every year  starting at age 31. Flexible sigmoidoscopy or colonoscopy. You may have a sigmoidoscopy every 5 years or a colonoscopy every 10 years starting at age 58. Hepatitis C blood test. Hepatitis B blood test. Sexually transmitted disease (STD) testing. Diabetes screening. This is done by checking your blood sugar (glucose) after you have not eaten for a while (fasting). You may have this done every 1-3 years. Mammogram. This may be done every 1-2 years. Talk to your health care provider about when you  should start having regular mammograms. This may depend on whether you have a family history of breast cancer. BRCA-related cancer screening. This may be done if you have a family history of breast, ovarian, tubal, or peritoneal cancers. Pelvic exam and Pap test. This may be done every 3 years starting at age 19. Starting at age 20, this may be done every 5 years if you have a Pap test in combination with an HPV test. Bone density scan. This is done to screen for osteoporosis. You may have this scan if you are at high risk for osteoporosis. Discuss your test results, treatment options, and if necessary, the need for more tests with your health care provider. Vaccines  Your health care provider may recommend certain vaccines, such as: Influenza vaccine. This is recommended every year. Tetanus, diphtheria, and acellular pertussis (Tdap, Td) vaccine. You may need a Td booster every 10 years. Zoster vaccine. You may need this after age 41. Pneumococcal 13-valent conjugate (PCV13) vaccine. You may need this if you have certain conditions and were not previously vaccinated. Pneumococcal polysaccharide (PPSV23) vaccine. You may need one or two doses if you smoke cigarettes or if you have certain conditions. Talk to your health care provider about which screenings and vaccines you need and how often you need them. This information is not intended to replace advice given to you by your health care provider. Make sure you discuss any questions you have with your health care provider. Document Released: 05/04/2015 Document Revised: 12/26/2015 Document Reviewed: 02/06/2015 Elsevier Interactive Patient Education  2017 [place] Prevention in the Home Falls can cause injuries. They can happen to people of all ages. There are many things you can do to make your home safe and to help prevent falls. What can I do on the outside of my home? Regularly fix the edges of walkways and driveways and fix  any cracks. Remove anything that might make you trip as you walk through a door, such as a raised step or threshold. Trim any bushes or trees on the path to your home. Use bright outdoor lighting. Clear any walking paths of anything that might make someone trip, such as rocks or tools. Regularly check to see if handrails are loose or broken. Make sure that both sides of any steps have handrails. Any raised decks and porches should have guardrails on the edges. Have any leaves, snow, or ice cleared regularly. Use sand or salt on walking paths during winter. Clean up any spills in your garage right away. This includes oil or grease spills. What can I do in the bathroom? Use night lights. Install grab bars by the toilet and in the tub and shower. Do not use towel bars as grab bars. Use non-skid mats or decals in the tub or shower. If you need to sit down in the shower, use a plastic, non-slip stool. Keep the floor dry. Clean up any water that spills on the floor as soon as it happens.  Remove soap buildup in the tub or shower regularly. Attach bath mats securely with double-sided non-slip rug tape. Do not have throw rugs and other things on the floor that can make you trip. What can I do in the bedroom? Use night lights. Make sure that you have a light by your bed that is easy to reach. Do not use any sheets or blankets that are too big for your bed. They should not hang down onto the floor. Have a firm chair that has side arms. You can use this for support while you get dressed. Do not have throw rugs and other things on the floor that can make you trip. What can I do in the kitchen? Clean up any spills right away. Avoid walking on wet floors. Keep items that you use a lot in easy-to-reach places. If you need to reach something above you, use a strong step stool that has a grab bar. Keep electrical cords out of the way. Do not use floor polish or wax that makes floors slippery. If you  must use wax, use non-skid floor wax. Do not have throw rugs and other things on the floor that can make you trip. What can I do with my stairs? Do not leave any items on the stairs. Make sure that there are handrails on both sides of the stairs and use them. Fix handrails that are broken or loose. Make sure that handrails are as long as the stairways. Check any carpeting to make sure that it is firmly attached to the stairs. Fix any carpet that is loose or worn. Avoid having throw rugs at the top or bottom of the stairs. If you do have throw rugs, attach them to the floor with carpet tape. Make sure that you have a light switch at the top of the stairs and the bottom of the stairs. If you do not have them, ask someone to add them for you. What else can I do to help prevent falls? Wear shoes that: Do not have high heels. Have rubber bottoms. Are comfortable and fit you well. Are closed at the toe. Do not wear sandals. If you use a stepladder: Make sure that it is fully opened. Do not climb a closed stepladder. Make sure that both sides of the stepladder are locked into place. Ask someone to hold it for you, if possible. Clearly mark and make sure that you can see: Any grab bars or handrails. First and last steps. Where the edge of each step is. Use tools that help you move around (mobility aids) if they are needed. These include: Canes. Walkers. Scooters. Crutches. Turn on the lights when you go into a dark area. Replace any light bulbs as soon as they burn out. Set up your furniture so you have a clear path. Avoid moving your furniture around. If any of your floors are uneven, fix them. If there are any pets around you, be aware of where they are. Review your medicines with your doctor. Some medicines can make you feel dizzy. This can increase your chance of falling. Ask your doctor what other things that you can do to help prevent falls. This information is not intended to replace  advice given to you by your health care provider. Make sure you discuss any questions you have with your health care provider. Document Released: 02/01/2009 Document Revised: 09/13/2015 Document Reviewed: 05/12/2014 Elsevier Interactive Patient Education  2017 [employer].

## 2022-05-21 DIAGNOSIS — E119 Type 2 diabetes mellitus without complications: Secondary | ICD-10-CM | POA: Diagnosis not present

## 2022-06-19 DIAGNOSIS — E119 Type 2 diabetes mellitus without complications: Secondary | ICD-10-CM | POA: Diagnosis not present

## 2022-07-04 ENCOUNTER — Ambulatory Visit: Payer: 59

## 2022-07-20 DIAGNOSIS — E119 Type 2 diabetes mellitus without complications: Secondary | ICD-10-CM | POA: Diagnosis not present

## 2022-07-29 ENCOUNTER — Other Ambulatory Visit: Payer: Self-pay | Admitting: Family Medicine

## 2022-08-17 ENCOUNTER — Other Ambulatory Visit: Payer: Self-pay | Admitting: Family Medicine

## 2022-08-19 DIAGNOSIS — E119 Type 2 diabetes mellitus without complications: Secondary | ICD-10-CM | POA: Diagnosis not present

## 2022-09-18 ENCOUNTER — Telehealth: Payer: Self-pay

## 2022-09-18 NOTE — Telephone Encounter (Signed)
Left message for patient to schedule her recall colonoscopy at [facility] hospital.

## 2022-09-19 DIAGNOSIS — E119 Type 2 diabetes mellitus without complications: Secondary | ICD-10-CM | POA: Diagnosis not present

## 2022-09-22 NOTE — Telephone Encounter (Signed)
Left message for patient to return my call.

## 2022-09-23 ENCOUNTER — Other Ambulatory Visit: Payer: Self-pay

## 2022-09-23 DIAGNOSIS — Z86010 Personal history of colonic polyps: Secondary | ICD-10-CM

## 2022-09-23 NOTE — Telephone Encounter (Signed)
Informed patient she is due for her recall colonoscopy for history of colon polyps. Patient agreed and scheduled her colon for 11/20/22 at 10:45, arriving at 9:15 am at [facility] hospital. Pre-visit telephone visit is scheduled for 11/04/22 at 1:00pm.

## 2022-10-30 ENCOUNTER — Telehealth: Payer: Self-pay | Admitting: *Deleted

## 2022-10-30 NOTE — Telephone Encounter (Signed)
Yesi,  This pt's BMI is greater than 50; their procedure will need to be performed at the hospital.  Thanks,  Rhyatt Muska 

## 2022-11-04 ENCOUNTER — Ambulatory Visit (AMBULATORY_SURGERY_CENTER): Payer: 59 | Admitting: *Deleted

## 2022-11-04 VITALS — Ht 59.0 in | Wt 337.0 lb

## 2022-11-04 DIAGNOSIS — Z86010 Personal history of colonic polyps: Secondary | ICD-10-CM

## 2022-11-04 MED ORDER — NA SULFATE-K SULFATE-MG SULF 17.5-3.13-1.6 GM/177ML PO SOLN
1.0000 | Freq: Once | ORAL | 0 refills | Status: AC
Start: 2022-11-04 — End: 2022-11-04

## 2022-11-04 NOTE — Progress Notes (Signed)
Pt's name and DOB verified at the beginning of the pre-visit.  Pt denies any difficulty with ambulating,sitting, laying down or rolling side to side Gave both [facility] main # and MD on call # prior to instructions.  No egg or soy allergy known to patient  No issues known to pt with past sedation with any surgeries or procedures Pt denies having issues being intubated Pt has no issues moving head neck or swallowing No FH of Malignant Hyperthermia Pt is not on diet pills Pt is not on home 02  Pt is not on blood thinners  Pt denies issues with constipation  instructions a week before prep days. Pt states they will Pt is not on dialysis Pt denise any abnormal heart rhythms  Pt denies any upcoming cardiac testing Pt encouraged to use to use Singlecare or Goodrx to reduce cost  Patient's chart reviewed by Cathlyn Parsons CNRA prior to pre-visit and patient appropriate for the [facility].  Pre-visit completed and red dot placed by patient's name on their procedure day (on provider's schedule).  . Visit by phone Pt states weight is 337 lb Instructed pt why it is important to and  to call if they have any changes in health or new medications. Directed them to the # given and on instructions.   Pt states they will.  Instructions reviewed with pt and pt states understanding. Instructed to review again prior to procedure. Pt states they will.  Instructions sent by mail with coupon and by my chart

## 2022-11-12 ENCOUNTER — Encounter (HOSPITAL_COMMUNITY): Payer: Self-pay | Admitting: Gastroenterology

## 2022-11-18 ENCOUNTER — Telehealth: Payer: Self-pay | Admitting: Gastroenterology

## 2022-11-18 NOTE — Telephone Encounter (Signed)
Inbound call from patient stating she would like to cancel 8/1 colonoscopy at [facility]. States she is worried about taking prep medication. Also is having trouble finding transportation. Also believes it is not a good time to proceed due to stomach issue she is having. Please advise, thank you.

## 2022-11-18 NOTE — Telephone Encounter (Signed)
Appt has been cancelled per pt she will call back as soon as she would like to proceed.

## 2022-11-20 ENCOUNTER — Encounter (HOSPITAL_COMMUNITY): Admission: RE | Payer: Self-pay | Source: Ambulatory Visit

## 2022-11-20 ENCOUNTER — Ambulatory Visit (HOSPITAL_COMMUNITY): Admission: RE | Admit: 2022-11-20 | Payer: 59 | Source: Ambulatory Visit | Admitting: Gastroenterology

## 2022-11-20 SURGERY — COLONOSCOPY WITH PROPOFOL
Anesthesia: Monitor Anesthesia Care

## 2022-12-08 ENCOUNTER — Other Ambulatory Visit: Payer: Self-pay | Admitting: Family Medicine

## 2022-12-18 ENCOUNTER — Telehealth: Payer: Self-pay | Admitting: Family Medicine

## 2022-12-18 NOTE — Telephone Encounter (Signed)
Aram Beecham from [employer] called stating patient's levothyroxine manufacturer has changed. They need approval to go ahead and fill patient's medication form the new manufacturer. They did fax a form but in case this wasn't received you can call [phone] with the reference number [id number] to approve of this verbally.

## 2023-01-10 ENCOUNTER — Other Ambulatory Visit: Payer: Self-pay | Admitting: Family Medicine

## 2023-03-13 ENCOUNTER — Telehealth: Payer: Self-pay | Admitting: Family Medicine

## 2023-03-13 NOTE — Telephone Encounter (Signed)
Prescription Request  03/13/2023  LOV: Visit date not found  What is the name of the medication or equipment? glipiZIDE (GLUCOTROL XL) 5 MG 24 hr tablet  levothyroxine (SYNTHROID) 112 MCG tablet   Have you contacted your pharmacy to request a refill? Yes   Which pharmacy would you like this sent to?    [facility] - [place], [place] - [address] [place]  [address] Phone: [phone] Fax: [phone]    Patient notified that their request is being sent to the clinical staff for review and that they should receive a response within 2 business days.   Please advise at Mobile [phone] (mobile)

## 2023-04-06 ENCOUNTER — Telehealth: Payer: Self-pay | Admitting: Family Medicine

## 2023-04-06 ENCOUNTER — Other Ambulatory Visit: Payer: Self-pay

## 2023-04-06 MED ORDER — LEVOTHYROXINE SODIUM 112 MCG PO TABS: ORAL_TABLET | ORAL | 2 refills | Status: DC

## 2023-04-06 MED ORDER — GLIPIZIDE ER 5 MG PO TB24: 5.0000 mg | ORAL_TABLET | Freq: Every day | ORAL | 2 refills | Status: DC

## 2023-04-06 NOTE — Telephone Encounter (Signed)
Prescription Request  04/06/2023  LOV: Visit date not found  What is the name of the medication or equipment? levothyroxine (SYNTHROID) 112 MCG tablet   AND  glipiZIDE (GLUCOTROL XL) 5 MG 24 hr tablet    Have you contacted your pharmacy to request a refill? Yes   Which pharmacy would you like this sent to?   [facility] [address], [place] - [address] [place] [address] Phone: [phone] Fax: [phone]    Patient notified that their request is being sent to the clinical staff for review and that they should receive a response within 2 business days.   Please advise at Mobile [phone] (mobile)

## 2023-04-06 NOTE — Telephone Encounter (Signed)
Rx sent 

## 2023-04-28 ENCOUNTER — Ambulatory Visit: Payer: 59 | Admitting: Family Medicine

## 2023-05-06 ENCOUNTER — Other Ambulatory Visit: Payer: Self-pay | Admitting: Family Medicine

## 2023-05-13 ENCOUNTER — Ambulatory Visit: Payer: 59 | Admitting: Family Medicine

## 2023-07-28 DIAGNOSIS — I10 Essential (primary) hypertension: Secondary | ICD-10-CM | POA: Diagnosis not present

## 2023-07-28 DIAGNOSIS — R9431 Abnormal electrocardiogram [ECG] [EKG]: Secondary | ICD-10-CM | POA: Diagnosis not present

## 2023-07-28 DIAGNOSIS — R6889 Other general symptoms and signs: Secondary | ICD-10-CM | POA: Diagnosis not present

## 2023-07-28 DIAGNOSIS — Z79899 Other long term (current) drug therapy: Secondary | ICD-10-CM | POA: Diagnosis not present

## 2023-07-28 DIAGNOSIS — I160 Hypertensive urgency: Secondary | ICD-10-CM | POA: Diagnosis not present

## 2023-07-28 DIAGNOSIS — R079 Chest pain, unspecified: Secondary | ICD-10-CM | POA: Diagnosis not present

## 2023-07-28 DIAGNOSIS — R0789 Other chest pain: Secondary | ICD-10-CM | POA: Diagnosis not present

## 2023-07-28 DIAGNOSIS — Z743 Need for continuous supervision: Secondary | ICD-10-CM | POA: Diagnosis not present

## 2023-07-29 DIAGNOSIS — I160 Hypertensive urgency: Secondary | ICD-10-CM | POA: Diagnosis not present

## 2023-07-29 DIAGNOSIS — I10 Essential (primary) hypertension: Secondary | ICD-10-CM | POA: Diagnosis not present

## 2023-07-29 DIAGNOSIS — R0789 Other chest pain: Secondary | ICD-10-CM | POA: Diagnosis not present

## 2023-07-30 DIAGNOSIS — R0789 Other chest pain: Secondary | ICD-10-CM | POA: Diagnosis not present

## 2023-07-30 DIAGNOSIS — I160 Hypertensive urgency: Secondary | ICD-10-CM | POA: Diagnosis not present

## 2023-07-30 DIAGNOSIS — I10 Essential (primary) hypertension: Secondary | ICD-10-CM | POA: Diagnosis not present

## 2023-08-18 ENCOUNTER — Ambulatory Visit (INDEPENDENT_AMBULATORY_CARE_PROVIDER_SITE_OTHER): Admitting: Family Medicine

## 2023-08-18 ENCOUNTER — Encounter: Payer: Self-pay | Admitting: Family Medicine

## 2023-08-18 VITALS — BP 156/86 | HR 101 | Temp 98.2°F | Ht 59.0 in | Wt 329.0 lb

## 2023-08-18 DIAGNOSIS — E782 Mixed hyperlipidemia: Secondary | ICD-10-CM | POA: Diagnosis not present

## 2023-08-18 DIAGNOSIS — Z7984 Long term (current) use of oral hypoglycemic drugs: Secondary | ICD-10-CM

## 2023-08-18 DIAGNOSIS — E1159 Type 2 diabetes mellitus with other circulatory complications: Secondary | ICD-10-CM

## 2023-08-18 DIAGNOSIS — E1169 Type 2 diabetes mellitus with other specified complication: Secondary | ICD-10-CM

## 2023-08-18 DIAGNOSIS — F319 Bipolar disorder, unspecified: Secondary | ICD-10-CM | POA: Diagnosis not present

## 2023-08-18 DIAGNOSIS — T7491XA Unspecified adult maltreatment, confirmed, initial encounter: Secondary | ICD-10-CM | POA: Diagnosis not present

## 2023-08-18 DIAGNOSIS — R4189 Other symptoms and signs involving cognitive functions and awareness: Secondary | ICD-10-CM | POA: Diagnosis not present

## 2023-08-18 DIAGNOSIS — I152 Hypertension secondary to endocrine disorders: Secondary | ICD-10-CM | POA: Diagnosis not present

## 2023-08-18 DIAGNOSIS — E1165 Type 2 diabetes mellitus with hyperglycemia: Secondary | ICD-10-CM

## 2023-08-18 DIAGNOSIS — E890 Postprocedural hypothyroidism: Secondary | ICD-10-CM | POA: Diagnosis not present

## 2023-08-18 LAB — LIPID PANEL
Cholesterol: 134 mg/dL (ref 0–200)
HDL: 47.4 mg/dL (ref >39.00)
LDL Cholesterol: 51 mg/dL (ref 0–99)
NonHDL: 87.07
Total CHOL/HDL Ratio: 3
Triglycerides: 182 mg/dL — ABNORMAL HIGH (ref 0.0–149.0)
VLDL: 36.4 mg/dL (ref 0.0–40.0)

## 2023-08-18 LAB — POCT GLYCOSYLATED HEMOGLOBIN (HGB A1C): Hemoglobin A1C: 9.2 % — AB (ref 4.0–5.6)

## 2023-08-18 LAB — MICROALBUMIN / CREATININE URINE RATIO
Creatinine,U: 118.9 mg/dL
Microalb Creat Ratio: 86.6 mg/g — ABNORMAL HIGH (ref 0.0–30.0)
Microalb, Ur: 10.3 mg/dL — ABNORMAL HIGH (ref 0.0–1.9)

## 2023-08-18 LAB — TSH: TSH: 1.28 u[IU]/mL (ref 0.35–5.50)

## 2023-08-18 MED ORDER — DAPAGLIFLOZIN PROPANEDIOL 10 MG PO TABS: 10.0000 mg | ORAL_TABLET | Freq: Every day | ORAL | 3 refills | Status: AC

## 2023-08-18 MED ORDER — GLIPIZIDE ER 10 MG PO TB24: 10.0000 mg | ORAL_TABLET | Freq: Every day | ORAL | 3 refills | Status: DC

## 2023-08-18 MED ORDER — LISINOPRIL 10 MG PO TABS: 10.0000 mg | ORAL_TABLET | Freq: Every day | ORAL | 3 refills | Status: DC

## 2023-08-18 NOTE — Patient Instructions (Signed)
 Please return in 3 months to recheck diabetes and blood pressure      If you have any questions or concerns, please don't hesitate to send me a message via MyChart or call the office at [phone]. Thank you for visiting with us  today! It's our pleasure caring for you.    VISIT SUMMARY: Today, we discussed your recent challenges, including housing, medication management, and your health conditions. We reviewed your diabetes, high blood pressure, and anxiety, and made adjustments to your treatment plan to help manage these issues more effectively.  YOUR PLAN: -TYPE 2 DIABETES MELLITUS WITH HYPERGLYCEMIA: This means your blood sugar levels are too high, which can cause symptoms like frequent urination and increased hunger. We have increased your Glucotrol  XL dose to 10 mg daily and prescribed Farxiga. Please be aware of the risk of yeast infections and maintain good hygiene. We also recommend dietary changes to reduce sugar intake and increase hydration. We will check your thyroid  and cholesterol levels and need a urine sample for further evaluation.  -HYPERTENSION: This means your blood pressure is too high, which can lead to serious health problems if not managed. We have prescribed lisinopril  to help control your blood pressure.  -ANXIETY AND PANIC ATTACKS: These are feelings of intense fear and worry that can be overwhelming. We encourage you to continue your efforts to attend psychiatric counseling with Addison Holster. We will also coordinate with social workers to provide additional support and case management.  INSTRUCTIONS: Please follow up with the recommended thyroid  and cholesterol labs, and provide a urine sample as instructed. Continue your efforts to attend psychiatric counseling and coordinate with social workers for additional support. Take your medications as prescribed and make the suggested dietary changes. If you have any questions or concerns, please contact our  office.                      Contains text generated by [employer]bridge.                                 Contains text generated by Abridge.
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
## 2023-08-20 ENCOUNTER — Telehealth: Payer: Self-pay | Admitting: *Deleted

## 2023-08-20 NOTE — Progress Notes (Signed)
 Complex Care Management Note  Care Guide Note 08/20/2023 Name: Megan Moreno MRN: [id number] DOB: 1970-09-11  [name] is a 53 y.o. year old female who sees Luevenia Saha, MD for primary care. I reached out to Hennie Locks by phone today to offer complex care management services.  [name] was given information about Complex Care Management services today including:   The Complex Care Management services include support from the care team which includes your Nurse Care Manager, Clinical Social Worker, or Pharmacist.  The Complex Care Management team is here to help remove barriers to the health concerns and goals most important to you. Complex Care Management services are voluntary, and the patient may decline or stop services at any time by request to their care team member.   Complex Care Management Consent Status: Patient agreed to services and verbal consent obtained.   Follow up plan:  Telephone appointment with complex care management team member scheduled for:  08/26/2023  Encounter Outcome:  Patient Scheduled  Kandis Ormond, CMA [place]  [facility] Guide Direct Dial: [phone]  Fax: [phone] Website: [email or web address]

## 2023-08-25 ENCOUNTER — Telehealth: Payer: Self-pay

## 2023-08-25 NOTE — Telephone Encounter (Signed)
 Copied from CRM [id number]. Topic: General - Other >> Aug 24, 2023  4:27 PM Dewanda Foots wrote: Reason for CRM:   Bethena Brothers with [facility] Callback [phone] wanting to know the referral for housing program-pt mentioned someone named "Myrtie Atkinson" and social worker wanted more information on this referral. Did not see anything in the notes other than to follow up. Please call Jenette Mitchell with information on this subject.  Spoke with Jenette Mitchell regarding [pt's referral and answered all the questions to the best of my knowledge that she asked.

## 2023-08-26 ENCOUNTER — Other Ambulatory Visit: Payer: Self-pay | Admitting: Licensed Clinical Social Worker

## 2023-08-26 NOTE — Patient Outreach (Signed)
 Complex Care Management   Visit Note  08/26/2023  Name:  Megan Moreno MRN: [id number] DOB: 1971/03/29  Situation: Referral received for Complex Care Management related to [place]/Behavioral Health diagnosis bi-polar  I obtained verbal consent from Patient.  Visit completed with patient  on the phone  Background:   Past Medical History:  Diagnosis Date   Ambulates with cane    Anxiety    Arthritis    Bipolar disorder (HCC) 12/09/2018   Cataract    bilateral - MD just watching now   Controlled type 2 diabetes mellitus without complication, without long-term current use of insulin (HCC) 12/09/2018   Depression    GERD (gastroesophageal reflux disease)    History of kidney stones    Hyperlipidemia    Hypertension    Hypoglycemia    Kidney stone    Morbid obesity (HCC) 12/09/2018   Nephrolithiasis 12/09/2018   OAB (overactive bladder)    Obesity    Osteoporosis    Post-operative hypothyroidism 12/09/2018   Thyroid  cancer (HCC)    Wears partial dentures    upper    Assessment: Patient Reported Symptoms:  Cognitive Cognitive Status: Alert and oriented to person, place, and time, Insightful and able to interpret abstract concepts, Normal speech and language skills Cognitive/Intellectual Conditions Management [RPT]: None reported or documented in medical history or problem list   Health Maintenance Behaviors: Annual physical exam, Stress management  Neurological Neurological Review of Symptoms: No symptoms reported    HEENT HEENT Symptoms Reported: Change or loss of hearing (Deaf in right ear) HEENT Conditions: Ear problem(s) HEENT Management Strategies: Routine screening HEENT Self-Management Outcome: 3 (uncertain) Ear problem(s)  Cardiovascular Cardiovascular Symptoms Reported: Fatigue Does patient have uncontrolled Hypertension?: No Cardiovascular Conditions: Hypertension Cardiovascular Management Strategies: Coping strategies, Medication therapy, Routine  screening Cardiovascular Self-Management Outcome: 3 (uncertain)  Respiratory Respiratory Symptoms Reported: No symptoms reported    Endocrine Patient reports the following symptoms related to hypoglycemia or hyperglycemia : Weakness or fatigue Is patient diabetic?: Yes Is patient checking blood sugars at home?: Yes Endocrine Conditions: Diabetes Endocrine Management Strategies: Activity, Diet modification, Medication therapy Endocrine Self-Management Outcome: 4 (good)  Gastrointestinal Gastrointestinal Symptoms Reported: No symptoms reported      Genitourinary Genitourinary Symptoms Reported: No symptoms reported    Integumentary Integumentary Symptoms Reported: No symptoms reported    Musculoskeletal Musculoskelatal Symptoms Reviewed: Difficulty walking, Muscle pain Additional Musculoskeletal Details: Arthritis Musculoskeletal Conditions: Osteoarthritis, Joint pain, Mobility limited Musculoskeletal Management Strategies: Activity, Coping strategies, Medication therapy, Medical device Musculoskeletal Self-Management Outcome: 4 (good) Falls in the past year?: No Number of falls in past year: 1 or less Was there an injury with Fall?: No Fall Risk Category Calculator: 0 Patient Fall Risk Level: Low Fall Risk Patient at Risk for Falls Due to: Impaired balance/gait  Psychosocial Psychosocial Symptoms Reported: No symptoms reported Behavioral Management Strategies: Medication therapy, Coping strategies, Counseling Behavioral Health Self-Management Outcome: 4 (good) Major Change/Loss/Stressor/Fears (CP): Environment Techniques to [employer] with Loss/Stress/Change: Counseling, Medication Quality of Family Relationships: helpful, involved, supportive Do you feel physically threatened by others?: No      08/26/2023    2:33 PM  Depression screen PHQ 2/9  Decreased Interest 1  Down, Depressed, Hopeless 2  PHQ - 2 Score 3  Altered sleeping 1  Tired, decreased energy 1  Change in appetite 0   Feeling bad or failure about yourself  1  Trouble concentrating 0  Moving slowly or fidgety/restless 0  PHQ-9 Score 6  Difficult doing work/chores Somewhat difficult  There were no vitals filed for this visit.  Medications Reviewed Today     Reviewed by Jens Molder, LCSW (Social Worker) on 08/26/23 at 1411  Med List Status: <None>   Medication Order Taking? Sig Documenting Provider Last Dose Status Informant  acetaminophen (TYLENOL) 500 MG tablet [id number] Yes Take 500 mg by mouth every 6 (six) hours as needed for mild pain, moderate pain or headache. [provider] Taking Active   atorvastatin  (LIPITOR) 80 MG tablet [id number] Yes TAKE 1 TABLET BY MOUTH ONCE  DAILY Luevenia Saha, MD Taking Active   Calcium  Carbonate-Vitamin D 600-400 MG-UNIT tablet [id number] Yes Take 1 tablet by mouth daily. [provider] Taking Active Self  dapagliflozin  propanediol (FARXIGA ) 10 MG TABS tablet [id number] Yes Take 1 tablet (10 mg total) by mouth daily. Luevenia Saha, MD Taking Active   glipiZIDE  (GLUCOTROL  XL) 10 MG 24 hr tablet [id number] Yes Take 1 tablet (10 mg total) by mouth daily with breakfast. Luevenia Saha, MD Taking Active   glucose blood test strip [id number] Yes Check blood sugar one - two times daily Luevenia Saha, MD Taking Active Self  levothyroxine  (SYNTHROID ) 112 MCG tablet [id number] Yes TAKE 2 TABLETS BY MOUTH ONCE DAILY BEFORE Aleene Hurry, MD Taking Active   lisinopril  (ZESTRIL ) 10 MG tablet [id number] Yes Take 1 tablet (10 mg total) by mouth daily. Luevenia Saha, MD Taking Active   LITHOBID 300 MG CR tablet [id number] Yes Take 600 mg by mouth at bedtime.  [provider] Taking Active Self           Med Note Shann Darnel, Jenine Mix A   Wed Dec 17, 2016  3:18 PM)    LORazepam (ATIVAN) 0.5 MG tablet [id number] No Take 1 tablet by mouth 2 (two) times daily as needed for anxiety.   Patient not taking: Reported on 08/18/2023   [provider] Not Taking Active Self  OVER THE COUNTER MEDICATION [id number]  Take 1 capsule by mouth daily. VH - hot flashes [provider]  Active Self            Recommendation:   Review education materials on low income housing and ALF in local area. Reach out to apartments/facilities for possible placement.  Follow Up Plan:   Telephone follow up appointment date/time:  09/16/2023  Hale Level, LCSW [place], [facility] Licensed Clinical Social Worker Care Coordinator [phone]

## 2023-08-26 NOTE — Patient Instructions (Signed)
 Visit Information  Thank you for taking time to visit with me today. Please don't hesitate to contact me if I can be of assistance to you before our next scheduled appointment.  Our next appointment is by telephone on 09/16/2023. Please call the care guide team at [phone] if you need to cancel or reschedule your appointment.   Following is a copy of your care plan:   Goals Addressed             This Visit's Progress    VBCI Social Work Care Plan       Problems:   Lacks knowledge of how to connect  and Corporate treasurer  and Housing   CSW Clinical Goal(s):   Over the next 6 weeks the Patient will attend all scheduled medical appointments as evidenced by patient report and care team review of appointment completion in electronic MEDICAL RECORD NUMBERPCP explore community resource options for unmet needs related to Financial Strain  and Housing  will follow up with list of ALF's as directed by Social Work.  Interventions:  Social Determinants of Health in Patient with Depression: SDOH assessments completed: Financial Strain  and Housing  Evaluation of current treatment plan related to unmet needs Review list of ALFs and reach out to them for possible placement, search for low income housing available through my housing search  Patient Goals/Self-Care Activities:  Continue taking your medication as prescribed.   Review educational material on ALFs and low income housing resources.  Plan:   Telephone follow up appointment with care management team member scheduled for:  09/16/2023        Please call the Suicide and Crisis Lifeline: 988 if you are experiencing a Mental Health or Behavioral Health Crisis or need someone to talk to.  Patient verbalizes understanding of instructions and care plan provided today and agrees to view in MyChart. Active MyChart status and patient understanding of how to access instructions and care plan via MyChart confirmed with patient.     Hale Level, LCSW /[facility], [facility] Licensed Clinical Social Worker Care Coordinator [phone]

## 2023-08-29 ENCOUNTER — Encounter: Payer: Self-pay | Admitting: Family Medicine

## 2023-08-29 DIAGNOSIS — R809 Proteinuria, unspecified: Secondary | ICD-10-CM | POA: Insufficient documentation

## 2023-08-29 NOTE — Progress Notes (Signed)
 See mychart note Dear Ms. Abel Abelson, I hope you are doing ok. I reviewed your lab results. Your cholesterol and thyroid  levels look good.  Have you started the farxiga  and are you taking the glucotrol  xl? These will be needed along with diet changes to get your diabetes under control. Your urine sample shows some early kidney damage. The lisinopril  and farxiga  will also be helpful for this. Please elt me know if you have trouble getting your medications.  Sincerely, Dr. Jonelle Neri

## 2023-09-01 DIAGNOSIS — H2513 Age-related nuclear cataract, bilateral: Secondary | ICD-10-CM | POA: Diagnosis not present

## 2023-09-01 DIAGNOSIS — H25011 Cortical age-related cataract, right eye: Secondary | ICD-10-CM | POA: Diagnosis not present

## 2023-09-01 DIAGNOSIS — H2522 Age-related cataract, morgagnian type, left eye: Secondary | ICD-10-CM | POA: Diagnosis not present

## 2023-09-03 DIAGNOSIS — H268 Other specified cataract: Secondary | ICD-10-CM | POA: Diagnosis not present

## 2023-09-16 ENCOUNTER — Other Ambulatory Visit: Payer: Self-pay | Admitting: Licensed Clinical Social Worker

## 2023-09-16 NOTE — Patient Outreach (Signed)
 Complex Care Management   Visit Note  09/16/2023  Name:  Megan Moreno MRN: [id number] DOB: Aug 03, 1970  Situation: Referral received for Complex Care Management related to Mental/Behavioral Health diagnosis Bi-Polar I obtained verbal consent from Patient.  Visit completed with patient  on the phone  Background:   Past Medical History:  Diagnosis Date   Ambulates with cane    Anxiety    Arthritis    Bipolar disorder (HCC) 12/09/2018   Cataract    bilateral - MD just watching now   Controlled type 2 diabetes mellitus without complication, without long-term current use of insulin (HCC) 12/09/2018   Depression    GERD (gastroesophageal reflux disease)    History of kidney stones    Hyperlipidemia    Hypertension    Hypoglycemia    Kidney stone    Morbid obesity (HCC) 12/09/2018   Nephrolithiasis 12/09/2018   OAB (overactive bladder)    Obesity    Osteoporosis    Post-operative hypothyroidism 12/09/2018   Thyroid  cancer (HCC)    Wears partial dentures    upper    Assessment: Patient Reported Symptoms:  Cognitive Cognitive Status: Alert and oriented to person, place, and time, Insightful and able to interpret abstract concepts, Normal speech and language skills Cognitive/Intellectual Conditions Management [RPT]: None reported or documented in medical history or problem list   Health Maintenance Behaviors: Annual physical exam, Stress management  Neurological Neurological Review of Symptoms: No symptoms reported    HEENT HEENT Symptoms Reported: Not assessed      Cardiovascular Cardiovascular Symptoms Reported: Not assessed    Respiratory Respiratory Symptoms Reported: Not assesed    Endocrine Patient reports the following symptoms related to hypoglycemia or hyperglycemia : Weakness or fatigue Is patient diabetic?: Yes Endocrine Conditions: Diabetes Endocrine Management Strategies: Activity, Diet modification, Medication therapy Endocrine Self-Management Outcome: 4  (good)  Gastrointestinal Gastrointestinal Symptoms Reported: No symptoms reported      Genitourinary Genitourinary Symptoms Reported: Not assessed    Integumentary Integumentary Symptoms Reported: Not assessed    Musculoskeletal Musculoskelatal Symptoms Reviewed: Difficulty walking, Muscle pain Additional Musculoskeletal Details: Arthritis Musculoskeletal Conditions: Osteoarthritis, Joint pain, Mobility limited Musculoskeletal Management Strategies: Activity, Coping strategies, Medication therapy, Medical device Musculoskeletal Self-Management Outcome: 4 (good) Falls in the past year?: No Number of falls in past year: 1 or less Was there an injury with Fall?: No Fall Risk Category Calculator: 0 Patient Fall Risk Level: Low Fall Risk Patient at Risk for Falls Due to: Impaired balance/gait  Psychosocial Psychosocial Symptoms Reported: Not assessed            08/26/2023    2:33 PM  Depression screen PHQ 2/9  Decreased Interest 1  Down, Depressed, Hopeless 2  PHQ - 2 Score 3  Altered sleeping 1  Tired, decreased energy 1  Change in appetite 0  Feeling bad or failure about yourself  1  Trouble concentrating 0  Moving slowly or fidgety/restless 0  PHQ-9 Score 6  Difficult doing work/chores Somewhat difficult    There were no vitals filed for this visit.  Medications Reviewed Today     Reviewed by Jens Molder, LCSW (Social Worker) on 09/16/23 at 1523  Med List Status: <None>   Medication Order Taking? Sig Documenting Provider Last Dose Status Informant  acetaminophen (TYLENOL) 500 MG tablet [id number] No Take 500 mg by mouth every 6 (six) hours as needed for mild pain, moderate pain or headache. [provider] Taking Active   atorvastatin  (LIPITOR) 80 MG tablet [id number]  No TAKE 1 TABLET BY MOUTH ONCE  DAILY Luevenia Saha, MD Taking Active   Calcium  Carbonate-Vitamin D 600-400 MG-UNIT tablet [id number] No Take 1 tablet by mouth daily. [provider]  Taking Active Self  dapagliflozin  propanediol (FARXIGA ) 10 MG TABS tablet [id number] No Take 1 tablet (10 mg total) by mouth daily. Luevenia Saha, MD Taking Active   glipiZIDE  (GLUCOTROL  XL) 10 MG 24 hr tablet [id number] No Take 1 tablet (10 mg total) by mouth daily with breakfast. Luevenia Saha, MD Taking Active   glucose blood test strip [id number] No Check blood sugar one - two times daily Luevenia Saha, MD Taking Active Self  levothyroxine  (SYNTHROID ) 112 MCG tablet [id number] No TAKE 2 TABLETS BY MOUTH ONCE DAILY BEFORE BREAKFAST Luevenia Saha, MD Taking Active   lisinopril  (ZESTRIL ) 10 MG tablet [id number] No Take 1 tablet (10 mg total) by mouth daily. Luevenia Saha, MD Taking Active   LITHOBID 300 MG CR tablet [id number] No Take 600 mg by mouth at bedtime.  [provider] Taking Active Self           Med Note Shann Darnel, Jenine Mix A   Wed Dec 17, 2016  3:18 PM)    LORazepam (ATIVAN) 0.5 MG tablet [id number] No Take 1 tablet by mouth 2 (two) times daily as needed for anxiety.   Patient not taking: Reported on 08/18/2023   [provider] Not Taking Active Self  OVER THE COUNTER MEDICATION [id number] No Take 1 capsule by mouth daily. VH - hot flashes [provider] Taking Active Self            Recommendation:   Pt reported she toured ALF but decided she was not interested in that due to lack of privacy. Pt is interested in lowincome housing. CSW provided information on housing resources and search tools. CSW also gave number for multiple low income housing options. Pt agreed to reach out to these. Denied any other needs at this time.   Follow Up Plan:   Telephone follow up appointment date/time:  10/19/2023  Hale Level, LCSW [place], [facility] Licensed Clinical Social Worker Care Coordinator [phone]

## 2023-09-16 NOTE — Patient Instructions (Signed)
 Visit Information  Thank you for taking time to visit with me today. Please don't hesitate to contact me if I can be of assistance to you before our next scheduled appointment.  Your next care management appointment is by telephone on 10/19/2023.   Please call the care guide team at [phone] if you need to cancel, schedule, or reschedule an appointment.   Please call the Suicide and Crisis Lifeline: 988 if you are experiencing a Mental Health or Behavioral Health Crisis or need someone to talk to.  Hale Level, LCSW [place], [facility] Licensed Clinical Social Worker Care Coordinator [phone]

## 2023-09-22 ENCOUNTER — Telehealth: Payer: Self-pay

## 2023-09-22 NOTE — Telephone Encounter (Signed)
 Megan Moreno

## 2023-09-22 NOTE — Telephone Encounter (Signed)
 Message has been sent to provider to address

## 2023-09-22 NOTE — Telephone Encounter (Signed)
 Copied from CRM [id number]. Topic: Clinical - Medical Advice >> Sep 21, 2023  4:18 PM Melissa C wrote: Reason for CRM: Patient is in need of an FL-2 or IDD diagnosis for proper housing arrangements. Please advise with patient's cousin as she is currently staying with her until they can get her into proper housing. Cousin is Gunnar Leff [phone]  FL-2 Form has been place on provider's desk for completion

## 2023-09-23 ENCOUNTER — Telehealth: Payer: Self-pay

## 2023-09-23 DIAGNOSIS — Z0279 Encounter for issue of other medical certificate: Secondary | ICD-10-CM

## 2023-09-23 NOTE — Telephone Encounter (Signed)
 Copied from CRM [id number]. Topic: Clinical - Medical Advice >> Sep 21, 2023  4:18 PM Melissa C wrote: Reason for CRM: Patient is in need of an FL-2 or IDD diagnosis for proper housing arrangements. Please advise with patient's cousin as she is currently staying with her until they can get her into proper housing. Cousin is Gunnar Leff [phone]  FL-2 form will be placed up front at check in and pt has been made aware

## 2023-09-23 NOTE — Telephone Encounter (Signed)
 Copied from CRM [id number]. Topic: General - Other >> Sep 23, 2023 12:41 PM Trula Gable C wrote: Reason for CRM: Patient called in stating she had a missed call, would like for someone to give her a callback   I have spoke with Leary Pro[place]cal inregards to FL-2 form which has been placed up front at check in

## 2023-10-05 DIAGNOSIS — H2512 Age-related nuclear cataract, left eye: Secondary | ICD-10-CM | POA: Diagnosis not present

## 2023-10-05 DIAGNOSIS — E119 Type 2 diabetes mellitus without complications: Secondary | ICD-10-CM | POA: Diagnosis not present

## 2023-10-05 DIAGNOSIS — I10 Essential (primary) hypertension: Secondary | ICD-10-CM | POA: Diagnosis not present

## 2023-10-05 DIAGNOSIS — H2589 Other age-related cataract: Secondary | ICD-10-CM | POA: Diagnosis not present

## 2023-10-05 DIAGNOSIS — H2513 Age-related nuclear cataract, bilateral: Secondary | ICD-10-CM | POA: Diagnosis not present

## 2023-10-19 ENCOUNTER — Other Ambulatory Visit: Payer: Self-pay | Admitting: Licensed Clinical Social Worker

## 2023-10-19 NOTE — Patient Outreach (Signed)
 Complex Care Management   Visit Note  10/19/2023  Name:  Megan Moreno MRN: [id number] DOB: 08/02/1970  Situation: Referral received for Complex Care Management related to Mental/Behavioral Health diagnosis bipolar I obtained verbal consent from Patient.  Visit completed with patient  on the phone  Background:   Past Medical History:  Diagnosis Date   Ambulates with cane    Anxiety    Arthritis    Bipolar disorder (HCC) 12/09/2018   Cataract    bilateral - MD just watching now   Controlled type 2 diabetes mellitus without complication, without long-term current use of insulin (HCC) 12/09/2018   Depression    GERD (gastroesophageal reflux disease)    History of kidney stones    Hyperlipidemia    Hypertension    Hypoglycemia    Kidney stone    Morbid obesity (HCC) 12/09/2018   Nephrolithiasis 12/09/2018   OAB (overactive bladder)    Obesity    Osteoporosis    Post-operative hypothyroidism 12/09/2018   Thyroid  cancer (HCC)    Wears partial dentures    upper    Assessment: Patient Reported Symptoms:  Cognitive Cognitive Status: Alert and oriented to person, place, and time, Normal speech and language skills, Insightful and able to interpret abstract concepts Cognitive/Intellectual Conditions Management [RPT]: None reported or documented in medical history or problem list   Health Maintenance Behaviors: Annual physical exam, Stress management  Neurological Neurological Review of Symptoms: No symptoms reported    HEENT HEENT Symptoms Reported: Not assessed      Cardiovascular Cardiovascular Symptoms Reported: Not assessed    Respiratory Respiratory Symptoms Reported: Not assesed    Endocrine Endocrine Symptoms Reported: Weakness or fatigue Is patient diabetic?: Yes Endocrine Self-Management Outcome: 4 (good)  Gastrointestinal Gastrointestinal Symptoms Reported: No symptoms reported      Genitourinary Genitourinary Symptoms Reported: Not assessed    Integumentary  Integumentary Symptoms Reported: Not assessed    Musculoskeletal Musculoskelatal Symptoms Reviewed: Difficulty walking, Muscle pain Additional Musculoskeletal Details: Arthritis Musculoskeletal Management Strategies: Activity, Coping strategies, Medication therapy, Medical device Musculoskeletal Self-Management Outcome: 4 (good) Falls in the past year?: No Number of falls in past year: 1 or less Was there an injury with Fall?: No Fall Risk Category Calculator: 0 Patient Fall Risk Level: Low Fall Risk    Psychosocial Psychosocial Symptoms Reported: Not assessed Behavioral Management Strategies: Medication therapy, Coping strategies Behavioral Health Self-Management Outcome: 4 (good) Major Change/Loss/Stressor/Fears (CP): Environment        08/26/2023    2:33 PM  Depression screen PHQ 2/9  Decreased Interest 1  Down, Depressed, Hopeless 2  PHQ - 2 Score 3  Altered sleeping 1  Tired, decreased energy 1  Change in appetite 0  Feeling bad or failure about yourself  1  Trouble concentrating 0  Moving slowly or fidgety/restless 0  PHQ-9 Score 6  Difficult doing work/chores Somewhat difficult    There were no vitals filed for this visit.  Medications Reviewed Today     Reviewed by Kit Alm LABOR, LCSW (Social Worker) on 10/19/23 at 1054  Med List Status: <None>   Medication Order Taking? Sig Documenting Provider Last Dose Status Informant  acetaminophen (TYLENOL) 500 MG tablet [id number] No Take 500 mg by mouth every 6 (six) hours as needed for mild pain, moderate pain or headache. [provider] Taking Active   atorvastatin  (LIPITOR) 80 MG tablet [id number] No TAKE 1 TABLET BY MOUTH ONCE  DAILY Jodie Lavern CROME, MD Taking Active   Calcium  Carbonate-Vitamin D  600-400 MG-UNIT tablet [id number] No Take 1 tablet by mouth daily. [provider] Taking Active Self  dapagliflozin  propanediol (FARXIGA ) 10 MG TABS tablet [id number] No Take 1 tablet (10 mg total) by mouth  daily. Jodie Lavern CROME, MD Taking Active   glipiZIDE  (GLUCOTROL  XL) 10 MG 24 hr tablet [id number] No Take 1 tablet (10 mg total) by mouth daily with breakfast. Jodie Lavern CROME, MD Taking Active   glucose blood test strip [id number] No Check blood sugar one - two times daily Jodie Lavern CROME, MD Taking Active Self  levothyroxine  (SYNTHROID ) 112 MCG tablet [id number] No TAKE 2 TABLETS BY MOUTH ONCE DAILY BEFORE BREAKFAST Jodie Lavern CROME, MD Taking Active   lisinopril  (ZESTRIL ) 10 MG tablet [id number] No Take 1 tablet (10 mg total) by mouth daily. Jodie Lavern CROME, MD Taking Active   LITHOBID 300 MG CR tablet [id number] No Take 600 mg by mouth at bedtime.  [provider] Taking Active Self           Med Note JERALYN, ROCKEY A   Wed Dec 17, 2016  3:18 PM)    LORazepam (ATIVAN) 0.5 MG tablet [id number] No Take 1 tablet by mouth 2 (two) times daily as needed for anxiety.   Patient not taking: Reported on 08/18/2023   [provider] Not Taking Active Self  OVER THE COUNTER MEDICATION [id number] No Take 1 capsule by mouth daily. VH - hot flashes [provider] Taking Active Self            Recommendation:   Pt reported that due to difficulty finding a adult care home that she can afford - she is going to look into low income apartments in her area. She is going to apply to an apartment in [place] that she will be able to afford and has started the application process for - pt denied any other needs at this time. CSW encouraged her to reach with any changes.  Follow Up Plan:   Patient has met all care management goals. Care Management case will be closed. Patient has been provided contact information should new needs arise.   Alm Armor, LCSW [place], [facility] Licensed Clinical Social Worker Care Coordinator [phone]

## 2023-10-30 ENCOUNTER — Other Ambulatory Visit: Payer: Self-pay

## 2023-10-30 ENCOUNTER — Telehealth: Payer: Self-pay

## 2023-10-30 ENCOUNTER — Telehealth: Payer: Self-pay | Admitting: Family Medicine

## 2023-10-30 MED ORDER — ATORVASTATIN CALCIUM 80 MG PO TABS: 80.0000 mg | ORAL_TABLET | Freq: Every day | ORAL | 2 refills | Status: DC

## 2023-10-30 MED ORDER — LEVOTHYROXINE SODIUM 112 MCG PO TABS: ORAL_TABLET | ORAL | 2 refills | Status: AC

## 2023-10-30 NOTE — Telephone Encounter (Signed)
 Copied from CRM [id number]. Topic: Clinical - Medication Refill >> Oct 30, 2023  3:40 PM Chasity T wrote: Medication:  levothyroxine  (SYNTHROID ) 112 MCG tablet atorvastatin  (LIPITOR) 80 MG tablet  Has the patient contacted their pharmacy? Yes   This is the patient's preferred pharmacy:    [id number] [place], [place] - [address] AT [facility] [address] & [facility] [address] [place] [address] Phone: [phone] Fax: [phone]  Is this the correct pharmacy for this prescription? Yes If no, delete pharmacy and type the correct one.   Has the prescription been filled recently? No  Is the patient out of the medication? Yes  Has the patient been seen for an appointment in the last year OR does the patient have an upcoming appointment? Yes  Can we respond through MyChart? Yes  Agent: Please be advised that Rx refills may take up to 3 business days. We ask that you follow-up with your pharmacy.  Rx sent

## 2023-10-30 NOTE — Telephone Encounter (Unsigned)
 Copied from CRM [id number]. Topic: Clinical - Medication Refill >> Oct 30, 2023  3:40 PM Chasity T wrote: Medication:  levothyroxine  (SYNTHROID ) 112 MCG tablet atorvastatin  (LIPITOR) 80 MG tablet  Has the patient contacted their pharmacy? Yes   This is the patient's preferred pharmacy:    [id number] [place], [place] - [address] AT [facility] [address] & [facility] [address] [place] [address] Phone: [phone] Fax: [phone]  Is this the correct pharmacy for this prescription? Yes If no, delete pharmacy and type the correct one.   Has the prescription been filled recently? No  Is the patient out of the medication? Yes  Has the patient been seen for an appointment in the last year OR does the patient have an upcoming appointment? Yes  Can we respond through MyChart? Yes  Agent: Please be advised that Rx refills may take up to 3 business days. We ask that you follow-up with your pharmacy.

## 2023-11-03 DIAGNOSIS — H2512 Age-related nuclear cataract, left eye: Secondary | ICD-10-CM | POA: Diagnosis not present

## 2023-11-13 ENCOUNTER — Telehealth: Payer: Self-pay

## 2023-11-13 NOTE — Telephone Encounter (Signed)
 Copied from CRM [id number]. Topic: General - Other >> Nov 13, 2023 12:01 PM Megan Moreno wrote: Reason for CRM: patient called stating the doctor filled out a form for her and she want to see if the $50 will be billed to her insurance  [phone]

## 2023-11-24 DIAGNOSIS — E1136 Type 2 diabetes mellitus with diabetic cataract: Secondary | ICD-10-CM | POA: Diagnosis not present

## 2023-11-24 DIAGNOSIS — H2512 Age-related nuclear cataract, left eye: Secondary | ICD-10-CM | POA: Diagnosis not present

## 2023-11-24 DIAGNOSIS — Z87891 Personal history of nicotine dependence: Secondary | ICD-10-CM | POA: Diagnosis not present

## 2023-11-24 DIAGNOSIS — E039 Hypothyroidism, unspecified: Secondary | ICD-10-CM | POA: Diagnosis not present

## 2023-11-24 DIAGNOSIS — Z7984 Long term (current) use of oral hypoglycemic drugs: Secondary | ICD-10-CM | POA: Diagnosis not present

## 2023-11-24 DIAGNOSIS — I10 Essential (primary) hypertension: Secondary | ICD-10-CM | POA: Diagnosis not present

## 2023-11-24 DIAGNOSIS — H2513 Age-related nuclear cataract, bilateral: Secondary | ICD-10-CM | POA: Diagnosis not present

## 2023-11-25 DIAGNOSIS — H2511 Age-related nuclear cataract, right eye: Secondary | ICD-10-CM | POA: Diagnosis not present

## 2023-11-26 ENCOUNTER — Ambulatory Visit: Admitting: Family Medicine

## 2023-12-08 DIAGNOSIS — H2511 Age-related nuclear cataract, right eye: Secondary | ICD-10-CM | POA: Diagnosis not present

## 2023-12-08 DIAGNOSIS — Z961 Presence of intraocular lens: Secondary | ICD-10-CM | POA: Diagnosis not present

## 2023-12-08 DIAGNOSIS — Z9842 Cataract extraction status, left eye: Secondary | ICD-10-CM | POA: Diagnosis not present

## 2023-12-08 DIAGNOSIS — E1136 Type 2 diabetes mellitus with diabetic cataract: Secondary | ICD-10-CM | POA: Diagnosis not present

## 2023-12-08 DIAGNOSIS — I10 Essential (primary) hypertension: Secondary | ICD-10-CM | POA: Diagnosis not present

## 2023-12-08 DIAGNOSIS — Z87891 Personal history of nicotine dependence: Secondary | ICD-10-CM | POA: Diagnosis not present

## 2023-12-08 DIAGNOSIS — Z7984 Long term (current) use of oral hypoglycemic drugs: Secondary | ICD-10-CM | POA: Diagnosis not present

## 2023-12-10 ENCOUNTER — Telehealth: Payer: Self-pay

## 2023-12-10 NOTE — Telephone Encounter (Signed)
 Copied from CRM #[id number]. Topic: General - Other >> Dec 10, 2023 12:24 PM [place] wrote: Reason for CRM: Patient will be in next month to pay the $50 fee for the forms that will filled out

## 2023-12-28 ENCOUNTER — Encounter: Payer: Self-pay | Admitting: Family Medicine

## 2023-12-28 ENCOUNTER — Ambulatory Visit: Admitting: Family Medicine

## 2023-12-28 VITALS — BP 117/79 | HR 86 | Temp 97.8°F | Ht 59.0 in | Wt 305.2 lb

## 2023-12-28 DIAGNOSIS — E890 Postprocedural hypothyroidism: Secondary | ICD-10-CM | POA: Diagnosis not present

## 2023-12-28 DIAGNOSIS — Z23 Encounter for immunization: Secondary | ICD-10-CM | POA: Diagnosis not present

## 2023-12-28 DIAGNOSIS — E1165 Type 2 diabetes mellitus with hyperglycemia: Secondary | ICD-10-CM

## 2023-12-28 DIAGNOSIS — Z1231 Encounter for screening mammogram for malignant neoplasm of breast: Secondary | ICD-10-CM

## 2023-12-28 DIAGNOSIS — Z6841 Body Mass Index (BMI) 40.0 and over, adult: Secondary | ICD-10-CM

## 2023-12-28 DIAGNOSIS — E1159 Type 2 diabetes mellitus with other circulatory complications: Secondary | ICD-10-CM | POA: Diagnosis not present

## 2023-12-28 DIAGNOSIS — E1169 Type 2 diabetes mellitus with other specified complication: Secondary | ICD-10-CM

## 2023-12-28 DIAGNOSIS — E782 Mixed hyperlipidemia: Secondary | ICD-10-CM

## 2023-12-28 DIAGNOSIS — I152 Hypertension secondary to endocrine disorders: Secondary | ICD-10-CM

## 2023-12-28 LAB — POCT GLYCOSYLATED HEMOGLOBIN (HGB A1C): Hemoglobin A1C: 7.2 % — AB (ref 4.0–5.6)

## 2023-12-28 MED ORDER — LISINOPRIL 5 MG PO TABS: 5.0000 mg | ORAL_TABLET | Freq: Every day | ORAL | 3 refills | Status: AC

## 2023-12-28 MED ORDER — REPAGLINIDE 1 MG PO TABS: 1.0000 mg | ORAL_TABLET | Freq: Three times a day (TID) | ORAL | 5 refills | Status: AC
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
## 2023-12-28 NOTE — Patient Instructions (Signed)
 Please return in 3 months to recheck diabetes and blood pressure    I have ordered a new medication called Prandin  to take before meals.  You can stop your Glucotrol  XL. I have lowered the dose of lisinopril , your blood pressure pill to 5 mg daily.  Please schedule a mammogram.  You can call the breast center to get scheduled. Please call the office checked below to schedule your appointment for your mammogram and/or bone density screen (the checked studies were ordered): [x]   Mammogram  []   Bone Density  [x]   [facility]     [address] [place], [place]        [phone]         []   [facility]  [address] [place], [place]  [phone]   If you have any questions or concerns, please don't hesitate to send me a message via MyChart or call the office at [phone]. Thank you for visiting with us  today! It's our pleasure caring for you.

## 2024-01-19 ENCOUNTER — Ambulatory Visit

## 2024-01-28 ENCOUNTER — Ambulatory Visit

## 2024-01-28 DIAGNOSIS — Z961 Presence of intraocular lens: Secondary | ICD-10-CM | POA: Diagnosis not present

## 2024-02-03 NOTE — Progress Notes (Signed)
 Megan Moreno                                          MRN: [id number]   02/03/2024   The [facility] Quality Team Specialist reviewed this patient medical record for the purposes of chart review for care gap closure. The following were reviewed: chart review for care gap closure-breast cancer screening.    [facility] Quality Team

## 2024-02-03 NOTE — Progress Notes (Signed)
 Megan Moreno                                          MRN: [id number]   02/03/2024   The [facility] Quality Team Specialist reviewed this patient medical record for the purposes of chart review for care gap closure. The following were reviewed: abstraction for care gap closure-glycemic status assessment.    [facility] Quality Team

## 2024-02-24 ENCOUNTER — Ambulatory Visit

## 2024-03-13 ENCOUNTER — Other Ambulatory Visit: Payer: Self-pay | Admitting: Family Medicine

## 2024-03-21 ENCOUNTER — Ambulatory Visit
Admission: EM | Admit: 2024-03-21 | Discharge: 2024-03-21 | Disposition: A | Discharge status: Home / Self Care | Attending: Nurse Practitioner | Admitting: Nurse Practitioner

## 2024-03-21 DIAGNOSIS — R350 Frequency of micturition: Secondary | ICD-10-CM | POA: Insufficient documentation

## 2024-03-21 DIAGNOSIS — N898 Other specified noninflammatory disorders of vagina: Secondary | ICD-10-CM | POA: Insufficient documentation

## 2024-03-21 LAB — POCT URINE DIPSTICK
Bilirubin, UA: NEGATIVE
Blood, UA: NEGATIVE
Glucose, UA: >=1000 mg/dL — AB
Ketones, POC UA: NEGATIVE mg/dL
Leukocytes, UA: NEGATIVE
Nitrite, UA: NEGATIVE
Protein Ur, POC: NEGATIVE mg/dL
Spec Grav, UA: 1.01 (ref 1.010–1.025)
Urobilinogen, UA: 0.2 U/dL
pH, UA: 6.5 (ref 5.0–8.0)

## 2024-03-21 MED ORDER — FLUCONAZOLE 150 MG PO TABS: ORAL_TABLET | ORAL | 0 refills | Status: DC

## 2024-03-21 NOTE — ED Triage Notes (Signed)
 Urinary frequency, blood in urine, lower back pain x 2 weeks. Taking AZO.  Vaginal itching x 4 days.
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
## 2024-03-21 NOTE — Discharge Instructions (Addendum)
 A urine culture and cytology swab are pending.  You will be contacted when the pending test results are received.  You will also have access to the results via MyChart. Take medication as prescribed. Make sure you are drinking at least 8-10 8 ounce glasses of water daily while symptoms persist. You may take over-the-counter Tylenol as needed for pain, fever, or general discomfort. Develop a toileting schedule that will allow you to urinate at least every 2 hours. Avoid caffeine such as tea, soda, or coffee while symptoms persist. If you are sexually active, make sure you are urinating at least 15 to 20 minutes after sexual intercourse. As discussed, if your urine culture result or cytology test results are negative and you continue to experience symptoms, please follow-up with your primary care physician for further evaluation.  Also recommend discussing possible referral to urology with your primary care physician. Follow-up as needed.

## 2024-03-23 ENCOUNTER — Ambulatory Visit (HOSPITAL_COMMUNITY): Payer: Self-pay

## 2024-03-23 LAB — URINE CULTURE: Culture: <10000 — AB

## 2024-03-23 LAB — CERVICOVAGINAL ANCILLARY ONLY
Bacterial Vaginitis (gardnerella): POSITIVE — AB
Candida Glabrata: POSITIVE — AB
Candida Vaginitis: POSITIVE — AB
Comment: NEGATIVE
Comment: NEGATIVE
Comment: NEGATIVE

## 2024-03-23 MED ORDER — METRONIDAZOLE 500 MG PO TABS: 500.0000 mg | ORAL_TABLET | Freq: Two times a day (BID) | ORAL | 0 refills | Status: AC

## 2024-03-30 ENCOUNTER — Encounter: Admitting: Family Medicine

## 2024-04-13 ENCOUNTER — Ambulatory Visit

## 2024-04-13 VITALS — Ht <= 58 in | Wt 305.0 lb

## 2024-04-13 DIAGNOSIS — Z0000 Encounter for general adult medical examination without abnormal findings: Secondary | ICD-10-CM | POA: Diagnosis not present

## 2024-04-13 NOTE — Progress Notes (Signed)
 "  Chief Complaint  Patient presents with   Medicare Wellness     Subjective:   Megan Moreno is a 53 y.o. female who presents for a Medicare Annual Wellness Visit.  Visit info / Clinical Intake: Medicare Wellness Visit Type:: Subsequent Annual Wellness Visit Persons participating in visit and providing information:: patient Medicare Wellness Visit Mode:: Telephone If telephone:: video declined Since this visit was completed virtually, some vitals may be partially provided or unavailable. Missing vitals are due to the limitations of the virtual format.: Unable to obtain vitals - no equipment If Telephone or Video please confirm:: I connected with patient using audio/video enable telemedicine. I verified patient identity with two identifiers, discussed telehealth limitations, and patient agreed to proceed. Patient Location:: home Provider Location:: home office Interpreter Needed?: No Pre-visit prep was completed: yes AWV questionnaire completed by patient prior to visit?: no Living arrangements:: (!) lives alone; other (lives in hotel) Patient's Overall Health Status Rating: (!) fair Typical amount of pain: none Does pain affect daily life?: no Are you currently prescribed opioids?: no  Dietary Habits and Nutritional Risks How many meals a day?: (!) 1 Eats fruit and vegetables daily?: (!) no Most meals are obtained by: preparing own meals In the last 2 weeks, have you had any of the following?: none Diabetic:: (!) yes Any non-healing wounds?: no How often do you check your BS?: 0 Would you like to be referred to a Nutritionist or for Diabetic Management? : no  Functional Status Activities of Daily Living (to include ambulation/medication): Independent Ambulation: Independent with device- listed below Home Assistive Devices/Equipment: Eyeglasses Medication Administration: Independent Home Management (perform basic housework or laundry): Independent Manage your own  finances?: yes Primary transportation is: family / friends Concerns about vision?: no *vision screening is required for WTM* Concerns about hearing?: (!) yes (right ear hard to hear) Uses hearing aids?: no Hear whispered voice?: yes  Fall Screening Falls in the past year?: 0 Number of falls in past year: 0 Was there an injury with Fall?: 0 Fall Risk Category Calculator: 0 Patient Fall Risk Level: Low Fall Risk  Fall Risk Patient at Risk for Falls Due to: No Fall Risks Fall risk Follow up: Falls evaluation completed  Home and Transportation Safety: All rugs have non-skid backing?: N/A, no rugs All stairs or steps have railings?: N/A, no stairs Grab bars in the bathtub or shower?: (!) no Have non-skid surface in bathtub or shower?: (!) no Good home lighting?: yes Regular seat belt use?: yes Hospital stays in the last year:: no  Cognitive Assessment Difficulty concentrating, remembering, or making decisions? : no Will 6CIT or Mini Cog be Completed: yes What year is it?: 0 points What month is it?: 0 points Give patient an address phrase to remember (5 components): [address] [place]  About what time is it?: 0 points Count backwards from 20 to 1: 0 points Say the months of the year in reverse: 4 points Repeat the address phrase from earlier: 2 points 6 CIT Score: 6 points  Advance Directives (For Healthcare) Does Patient Have a Medical Advance Directive?: No Would patient like information on creating a medical advance directive?: No - Patient declined  Reviewed/Updated  Reviewed/Updated: Reviewed All (Medical, Surgical, Family, Medications, Allergies, Care Teams, Patient Goals)    Allergies (verified) Myrbetriq [mirabegron], Hydrocodone, Codeine, Latex, Morphine and codeine, Omeprazole , and Statins   Current Medications (verified) Outpatient Encounter Medications as of 04/13/2024  Medication Sig   acetaminophen (TYLENOL) 500 MG tablet  Take 500 mg by mouth every 6  (six) hours as needed for mild pain, moderate pain or headache.   atorvastatin  (LIPITOR) 80 MG tablet TAKE 1 TABLET BY MOUTH ONCE  DAILY   Calcium  Carbonate-Vitamin D 600-400 MG-UNIT tablet Take 1 tablet by mouth daily.   dapagliflozin  propanediol (FARXIGA ) 10 MG TABS tablet Take 1 tablet (10 mg total) by mouth daily.   levothyroxine  (SYNTHROID ) 112 MCG tablet TAKE 2 TABLETS BY MOUTH ONCE DAILY BEFORE BREAKFAST   lisinopril  (ZESTRIL ) 5 MG tablet Take 1 tablet (5 mg total) by mouth daily.   LITHOBID 300 MG CR tablet Take 600 mg by mouth at bedtime.    LORazepam (ATIVAN) 0.5 MG tablet Take 1 tablet by mouth 2 (two) times daily as needed for anxiety.    OVER THE COUNTER MEDICATION Take 1 capsule by mouth daily. VH - hot flashes   glucose blood test strip Check blood sugar one - two times daily (Patient not taking: Reported on 04/13/2024)   repaglinide  (PRANDIN ) 1 MG tablet Take 1 tablet (1 mg total) by mouth 3 (three) times daily before meals. (Patient not taking: Reported on 04/13/2024)   [DISCONTINUED] fluconazole  (DIFLUCAN ) 150 MG tablet Take 1 tablet by mouth today.  May repeat in 72 hours if symptoms continue to persist.   No facility-administered encounter medications on file as of 04/13/2024.    History: Past Medical History:  Diagnosis Date   Ambulates with cane    Anxiety    Arthritis    Bipolar disorder (HCC) 12/09/2018   Cataract    bilateral - MD just watching now   Controlled type 2 diabetes mellitus without complication, without long-term current use of insulin (HCC) 12/09/2018   Depression    GERD (gastroesophageal reflux disease)    History of kidney stones    Hyperlipidemia    Hypertension    Hypoglycemia    Kidney stone    Morbid obesity (HCC) 12/09/2018   Nephrolithiasis 12/09/2018   OAB (overactive bladder)    Obesity    Osteoporosis    Post-operative hypothyroidism 12/09/2018   Thyroid  cancer (HCC)    Wears partial dentures    upper   Past Surgical  History:  Procedure Laterality Date   COLONOSCOPY WITH PROPOFOL  N/A 12/13/2019   Procedure: COLONOSCOPY WITH PROPOFOL ;  Surgeon: Aneita Gwendlyn DASEN, MD;  Location: [facility];  Service: Endoscopy;  Laterality: N/A;   ESOPHAGOGASTRODUODENOSCOPY (EGD) WITH PROPOFOL  N/A 12/23/2016   Procedure: ESOPHAGOGASTRODUODENOSCOPY (EGD) WITH PROPOFOL ;  Surgeon: Aneita Gwendlyn DASEN, MD;  Location: [facility];  Service: Endoscopy;  Laterality: N/A;   HEEL SPUR SURGERY Left    LITHOTRIPSY     PARTIAL HYSTERECTOMY     POLYPECTOMY  12/13/2019   Procedure: POLYPECTOMY;  Surgeon: Aneita Gwendlyn DASEN, MD;  Location: [facility];  Service: Endoscopy;;   rotator cuff surgery Bilateral    THYROIDECTOMY     TONSILLECTOMY AND ADENOIDECTOMY     and adenoidectomy    Family History  Problem Relation Age of Onset   Ovarian cancer Mother    Colon cancer Neg Hx    Esophageal cancer Neg Hx    Pancreatic cancer Neg Hx    Stomach cancer Neg Hx    Liver disease Neg Hx    Thyroid  cancer Neg Hx    Thyroid  disease Neg Hx    Rectal cancer Neg Hx    Colon polyps Neg Hx    Social History   Occupational History   Not on file  Tobacco Use  Smoking status: Never   Smokeless tobacco: Never  Vaping Use   Vaping status: Never Used  Substance and Sexual Activity   Alcohol use: Not Currently    Comment: occasionally   Drug use: No   Sexual activity: Never    Birth control/protection: Surgical    Comment: Hysterectomy   Tobacco Counseling Counseling given: Not Answered  SDOH Screenings   Food Insecurity: Food Insecurity Present (04/13/2024)  Housing: High Risk (04/13/2024)  Transportation Needs: No Transportation Needs (04/13/2024)  Utilities: Not At Risk (04/13/2024)  Depression (PHQ2-9): Low Risk (04/13/2024)  Financial Resource Strain: Low Risk (05/13/2022)  Physical Activity: Inactive (04/13/2024)  Social Connections: Socially Isolated (04/13/2024)  Stress: No Stress Concern Present (04/13/2024)  Tobacco Use:  Low Risk (04/13/2024)  Health Literacy: Adequate Health Literacy (04/13/2024)   See flowsheets for full screening details  Depression Screen PHQ 2 & 9 Depression Scale- Over the past 2 weeks, how often have you been bothered by any of the following problems? Little interest or pleasure in doing things: 0 Feeling down, depressed, or hopeless (PHQ Adolescent also includes...irritable): 1 PHQ-2 Total Score: 1 Trouble falling or staying asleep, or sleeping too much: 1 Feeling tired or having little energy: 1 Poor appetite or overeating (PHQ Adolescent also includes...weight loss): 0 Feeling bad about yourself - or that you are a failure or have let yourself or your family down: 1 Trouble concentrating on things, such as reading the newspaper or watching television (PHQ Adolescent also includes...like school work): 0 Moving or speaking so slowly that other people could have noticed. Or the opposite - being so fidgety or restless that you have been moving around a lot more than usual: 0 PHQ-9 Total Score: 6 If you checked off any problems, how difficult have these problems made it for you to do your work, take care of things at home, or get along with other people?: Somewhat difficult  Depression Treatment Depression Interventions/Treatment : Counseling; Currently on Treatment; Medication     Goals Addressed               This Visit's Progress     weight control (pt-stated)        Weight control              Objective:    Today's Vitals   04/13/24 1023  Weight: (!) 305 lb (138.3 kg)  Height: 4' 10 (1.473 m)   Body mass index is 63.75 kg/m.  Hearing/Vision screen Hearing Screening - Comments:: Right ear HOH  Vision Screening - Comments:: Encouraged to follow up with eye care  Immunizations and Health Maintenance Health Maintenance  Topic Date Due   Hepatitis B Vaccines 19-59 Average Risk (1 of 3 - 19+ 3-dose series) Never done   Zoster Vaccines- Shingrix  (1 of 2)  Never done   DTaP/Tdap/Td (2 - Tdap) 06/01/2019   COVID-19 Vaccine (2 - Janssen risk series) 09/18/2019   OPHTHALMOLOGY EXAM  06/09/2020   Mammogram  08/10/2020   Diabetic kidney evaluation - eGFR measurement  02/18/2022   Colonoscopy  12/13/2022   HEMOGLOBIN A1C  06/26/2024   Diabetic kidney evaluation - Urine ACR  08/17/2024   FOOT EXAM  12/27/2024   Medicare Annual Wellness (AWV)  04/13/2025   Pneumococcal Vaccine: 50+ Years  Completed   Influenza Vaccine  Completed   Hepatitis C Screening  Completed   HPV VACCINES  Aged Out   Meningococcal B Vaccine  Aged Out   HIV Screening  Discontinued  Assessment/Plan:  This is a routine wellness examination for Megan Moreno.  Patient Care Team: Jodie Lavern CROME, MD as PCP - General (Family Medicine) Darden Planas, NP as Nurse Practitioner Joshua Rush, OD as Consulting Physician (Optometry) Aneita Gwendlyn DASEN, MD (Inactive) as Consulting Physician (Gastroenterology)  I have personally reviewed and noted the following in the patients chart:   Medical and social history Use of alcohol, tobacco or illicit drugs  Current medications and supplements including opioid prescriptions. Functional ability and status Nutritional status Physical activity Advanced directives List of other physicians Hospitalizations, surgeries, and ER visits in previous 12 months Vitals Screenings to include cognitive, depression, and falls Referrals and appointments  No orders of the defined types were placed in this encounter.  In addition, I have reviewed and discussed with patient certain preventive protocols, quality metrics, and best practice recommendations. A written personalized care plan for preventive services as well as general preventive health recommendations were provided to patient.   Megan VEAR Haws, LPN   [id number]   Return in about 1 year (around 04/17/2025).  After Visit Summary: (MyChart) Due to this being a telephonic visit, the  after visit summary with patients personalized plan was offered to patient via MyChart   Nurse Notes: HM Addressed: Pt declined mammogram and colonoscopy at this time. Pt also declined any housing and food services at this time   "

## 2024-04-13 NOTE — Patient Instructions (Signed)
 Megan Moreno,  Thank you for taking the time for your Medicare Wellness Visit. I appreciate your continued commitment to your health goals. Please review the care plan we discussed, and feel free to reach out if I can assist you further.  Please note that Annual Wellness Visits do not include a physical exam. Some assessments may be limited, especially if the visit was conducted virtually. If needed, we may recommend an in-person follow-up with your provider.  Ongoing Care Seeing your primary care provider every 3 to 6 months helps us  monitor your health and provide consistent, personalized care.   Referrals If a referral was made during today's visit and you haven't received any updates within two weeks, please contact the referred provider directly to check on the status.  Recommended Screenings:  Health Maintenance  Topic Date Due   Hepatitis B Vaccine (1 of 3 - 19+ 3-dose series) Never done   Zoster (Shingles) Vaccine (1 of 2) Never done   DTaP/Tdap/Td vaccine (2 - Tdap) 06/01/2019   COVID-19 Vaccine (2 - Janssen risk series) 09/18/2019   Eye exam for diabetics  06/09/2020   Breast Cancer Screening  08/10/2020   Yearly kidney function blood test for diabetes  02/18/2022   Colon Cancer Screening  12/13/2022   Hemoglobin A1C  06/26/2024   Yearly kidney health urinalysis for diabetes  08/17/2024   Complete foot exam   12/27/2024   Medicare Annual Wellness Visit  04/13/2025   Pneumococcal Vaccine for age over 53  Completed   Flu Shot  Completed   Hepatitis C Screening  Completed   HPV Vaccine  Aged Out   Meningitis B Vaccine  Aged Out   HIV Screening  Discontinued       04/13/2024   10:28 AM  Advanced Directives  Does Patient Have a Medical Advance Directive? No  Would patient like information on creating a medical advance directive? No - Patient declined    Vision: Annual vision screenings are recommended for early detection of glaucoma, cataracts, and diabetic retinopathy.  These exams can also reveal signs of chronic conditions such as diabetes and high blood pressure.  Dental: Annual dental screenings help detect early signs of oral cancer, gum disease, and other conditions linked to overall health, including heart disease and diabetes.  Please see the attached documents for additional preventive care recommendations.

## 2024-08-02 ENCOUNTER — Encounter: Admitting: Family Medicine
